# Patient Record
Sex: Female | Born: 1945 | Race: White | Hispanic: No | Marital: Married | Smoking: Former smoker
Health system: Southern US, Community
[De-identification: ages and names within clinical notes are randomized; demographics above are authoritative.]

## PROBLEM LIST (undated history)

## (undated) DIAGNOSIS — F329 Major depressive disorder, single episode, unspecified: Secondary | ICD-10-CM

## (undated) DIAGNOSIS — N189 Chronic kidney disease, unspecified: Secondary | ICD-10-CM

## (undated) DIAGNOSIS — I10 Essential (primary) hypertension: Secondary | ICD-10-CM

## (undated) DIAGNOSIS — Z9289 Personal history of other medical treatment: Secondary | ICD-10-CM

## (undated) DIAGNOSIS — F32A Depression, unspecified: Secondary | ICD-10-CM

## (undated) DIAGNOSIS — C801 Malignant (primary) neoplasm, unspecified: Secondary | ICD-10-CM

## (undated) HISTORY — DX: Chronic kidney disease, unspecified: N18.9

## (undated) HISTORY — PX: COLON SURGERY: SHX602

## (undated) HISTORY — PX: AV FISTULA PLACEMENT: SHX1204

## (undated) HISTORY — PX: CHOLECYSTECTOMY: SHX55

## (undated) HISTORY — DX: Essential (primary) hypertension: I10

## (undated) HISTORY — PX: APPENDECTOMY: SHX54

---

## 1978-09-05 DIAGNOSIS — C189 Malignant neoplasm of colon, unspecified: Secondary | ICD-10-CM | POA: Insufficient documentation

## 2008-02-15 ENCOUNTER — Emergency Department (HOSPITAL_COMMUNITY): Admission: EM | Admit: 2008-02-15 | Discharge: 2008-02-15 | Payer: Self-pay | Admitting: Emergency Medicine

## 2008-07-25 ENCOUNTER — Ambulatory Visit: Payer: Self-pay | Admitting: Nurse Practitioner

## 2008-07-25 DIAGNOSIS — R197 Diarrhea, unspecified: Secondary | ICD-10-CM

## 2008-08-07 LAB — CONVERTED CEMR LAB
AST: 15 units/L (ref 0–37)
Alkaline Phosphatase: 226 units/L — ABNORMAL HIGH (ref 39–117)
BUN: 22 mg/dL (ref 6–23)
Basophils Relative: 0 % (ref 0–1)
Creatinine, Ser: 2.75 mg/dL — ABNORMAL HIGH (ref 0.40–1.20)
Eosinophils Absolute: 0.2 10*3/uL (ref 0.0–0.7)
Eosinophils Relative: 4 % (ref 0–5)
Glucose, Bld: 88 mg/dL (ref 70–99)
HCT: 42.3 % (ref 36.0–46.0)
Lymphs Abs: 1.9 10*3/uL (ref 0.7–4.0)
MCHC: 32.4 g/dL (ref 30.0–36.0)
MCV: 93.6 fL (ref 78.0–100.0)
Monocytes Relative: 8 % (ref 3–12)
Neutrophils Relative %: 57 % (ref 43–77)
RBC: 4.52 M/uL (ref 3.87–5.11)
Total Bilirubin: 0.5 mg/dL (ref 0.3–1.2)
WBC: 6 10*3/uL (ref 4.0–10.5)

## 2008-08-08 ENCOUNTER — Encounter (INDEPENDENT_AMBULATORY_CARE_PROVIDER_SITE_OTHER): Payer: Self-pay | Admitting: Nurse Practitioner

## 2008-08-11 ENCOUNTER — Telehealth (INDEPENDENT_AMBULATORY_CARE_PROVIDER_SITE_OTHER): Payer: Self-pay | Admitting: Nurse Practitioner

## 2008-08-11 ENCOUNTER — Ambulatory Visit: Payer: Self-pay | Admitting: Nurse Practitioner

## 2008-08-12 ENCOUNTER — Encounter (INDEPENDENT_AMBULATORY_CARE_PROVIDER_SITE_OTHER): Payer: Self-pay | Admitting: Nurse Practitioner

## 2008-08-12 LAB — CONVERTED CEMR LAB
BUN: 30 mg/dL — ABNORMAL HIGH (ref 6–23)
CO2: 22 meq/L (ref 19–32)
Chloride: 115 meq/L — ABNORMAL HIGH (ref 96–112)
Glucose, Bld: 97 mg/dL (ref 70–99)
Potassium: 5.1 meq/L (ref 3.5–5.3)
Sodium: 150 meq/L — ABNORMAL HIGH (ref 135–145)

## 2008-08-13 ENCOUNTER — Telehealth (INDEPENDENT_AMBULATORY_CARE_PROVIDER_SITE_OTHER): Payer: Self-pay | Admitting: Nurse Practitioner

## 2008-08-20 ENCOUNTER — Ambulatory Visit: Payer: Self-pay | Admitting: Nurse Practitioner

## 2008-08-21 ENCOUNTER — Encounter (INDEPENDENT_AMBULATORY_CARE_PROVIDER_SITE_OTHER): Payer: Self-pay | Admitting: Nurse Practitioner

## 2008-08-25 LAB — CONVERTED CEMR LAB
Collection Interval-CRCL: 24 hr
Creatinine Clearance: 15 mL/min — ABNORMAL LOW (ref 75–115)
Protein, Ur: 140 mg/24hr — ABNORMAL HIGH (ref 50–100)

## 2008-09-02 ENCOUNTER — Ambulatory Visit: Payer: Self-pay | Admitting: Nurse Practitioner

## 2008-09-02 DIAGNOSIS — N259 Disorder resulting from impaired renal tubular function, unspecified: Secondary | ICD-10-CM | POA: Insufficient documentation

## 2008-09-02 LAB — CONVERTED CEMR LAB
Bilirubin Urine: NEGATIVE
Blood in Urine, dipstick: NEGATIVE
Ketones, urine, test strip: NEGATIVE
Specific Gravity, Urine: <1.005

## 2008-09-03 ENCOUNTER — Encounter (INDEPENDENT_AMBULATORY_CARE_PROVIDER_SITE_OTHER): Payer: Self-pay | Admitting: Nurse Practitioner

## 2008-09-03 LAB — CONVERTED CEMR LAB
Calcium: 9.6 mg/dL (ref 8.4–10.5)
Chloride: 116 meq/L — ABNORMAL HIGH (ref 96–112)
Creatinine, Ser: 2.56 mg/dL — ABNORMAL HIGH (ref 0.40–1.20)
Sodium: 145 meq/L (ref 135–145)

## 2008-09-12 ENCOUNTER — Telehealth (INDEPENDENT_AMBULATORY_CARE_PROVIDER_SITE_OTHER): Payer: Self-pay | Admitting: Nurse Practitioner

## 2008-09-12 ENCOUNTER — Ambulatory Visit (HOSPITAL_COMMUNITY): Admission: RE | Admit: 2008-09-12 | Discharge: 2008-09-12 | Payer: Self-pay | Admitting: Internal Medicine

## 2008-09-17 ENCOUNTER — Ambulatory Visit: Payer: Self-pay | Admitting: Nurse Practitioner

## 2008-10-09 ENCOUNTER — Encounter (INDEPENDENT_AMBULATORY_CARE_PROVIDER_SITE_OTHER): Payer: Self-pay | Admitting: Nurse Practitioner

## 2008-10-24 ENCOUNTER — Ambulatory Visit: Payer: Self-pay | Admitting: Nurse Practitioner

## 2008-10-24 DIAGNOSIS — F39 Unspecified mood [affective] disorder: Secondary | ICD-10-CM | POA: Insufficient documentation

## 2009-03-30 ENCOUNTER — Telehealth (INDEPENDENT_AMBULATORY_CARE_PROVIDER_SITE_OTHER): Payer: Self-pay | Admitting: Nurse Practitioner

## 2010-05-28 ENCOUNTER — Encounter (INDEPENDENT_AMBULATORY_CARE_PROVIDER_SITE_OTHER): Payer: Self-pay | Admitting: Nurse Practitioner

## 2010-10-05 NOTE — Progress Notes (Signed)
Summary: Office Visit//DEPRESSION SCREENING  Office Visit//DEPRESSION SCREENING   Imported By: Arta Bruce 05/28/2010 14:29:51  _____________________________________________________________________  External Attachment:    Type:   Image     Comment:   External Document

## 2011-07-15 ENCOUNTER — Encounter: Payer: Self-pay | Admitting: Family Medicine

## 2011-07-15 ENCOUNTER — Ambulatory Visit (INDEPENDENT_AMBULATORY_CARE_PROVIDER_SITE_OTHER): Payer: Medicare Other | Admitting: Family Medicine

## 2011-07-15 DIAGNOSIS — Z1231 Encounter for screening mammogram for malignant neoplasm of breast: Secondary | ICD-10-CM

## 2011-07-15 DIAGNOSIS — F39 Unspecified mood [affective] disorder: Secondary | ICD-10-CM

## 2011-07-15 DIAGNOSIS — Z79899 Other long term (current) drug therapy: Secondary | ICD-10-CM | POA: Insufficient documentation

## 2011-07-15 DIAGNOSIS — C189 Malignant neoplasm of colon, unspecified: Secondary | ICD-10-CM

## 2011-07-15 DIAGNOSIS — N259 Disorder resulting from impaired renal tubular function, unspecified: Secondary | ICD-10-CM

## 2011-07-15 LAB — CBC WITH DIFFERENTIAL/PLATELET
Basophils Relative: 0.3 % (ref 0.0–3.0)
Eosinophils Relative: 7.5 % — ABNORMAL HIGH (ref 0.0–5.0)
HCT: 35 % — ABNORMAL LOW (ref 36.0–46.0)
Lymphs Abs: 1.2 10*3/uL (ref 0.7–4.0)
MCHC: 33.3 g/dL (ref 30.0–36.0)
MCV: 96.8 fl (ref 78.0–100.0)
Monocytes Absolute: 0.3 10*3/uL (ref 0.1–1.0)
RBC: 3.61 Mil/uL — ABNORMAL LOW (ref 3.87–5.11)
WBC: 5.1 10*3/uL (ref 4.5–10.5)

## 2011-07-15 LAB — BASIC METABOLIC PANEL
BUN: 39 mg/dL — ABNORMAL HIGH (ref 6–23)
CO2: 18 mEq/L — ABNORMAL LOW (ref 19–32)
Chloride: 116 mEq/L — ABNORMAL HIGH (ref 96–112)
Potassium: 5.3 mEq/L — ABNORMAL HIGH (ref 3.5–5.1)

## 2011-07-15 LAB — HEPATIC FUNCTION PANEL
ALT: 8 U/L (ref 0–35)
Albumin: 3.8 g/dL (ref 3.5–5.2)
Total Protein: 6.9 g/dL (ref 6.0–8.3)

## 2011-07-15 LAB — LIPID PANEL: Cholesterol: 151 mg/dL (ref 0–200)

## 2011-07-15 LAB — TSH: TSH: 3.38 u[IU]/mL (ref 0.35–5.50)

## 2011-07-15 MED ORDER — DIPHENOXYLATE-ATROPINE 2.5-0.025 MG PO TABS: 1.0000 | ORAL_TABLET | Freq: Four times a day (QID) | ORAL | Status: AC | PRN

## 2011-07-15 NOTE — Progress Notes (Signed)
  Subjective:    Patient ID: Tammy Hutchinson, female    DOB: 02-11-1946, 65 y.o.   MRN: [id number]  HPI New to establish.  Previous MD- [facility].  [employer] (sees [facility] months).  Colon cancer- had bowel resection, Dr Randa Evens is GI.  UTD on colonoscopy.  Having scopes every 1-2 yrs.  Mood Disorder- pt unsure of dx.  On antipsychotic.  Follows w/ mental health.  Renal insufficiency- this is on pt's problem list.  Has seen Dr Caryn Section in the past but 'i didn't like him'.  Has not had labs done recently.  Health maintenance- overdue on mammo and pap  **pt is very difficult to get info from.  Has limited understanding of questions being asked.  Will blurt out random statements that in no way relate to the question**   Review of Systems No CP, SOB, anorexia, abd pain, HAs, edema, abnormal bleeding or bruising, insomnia, N/V, visual changes, auditory or visual hallucinations, dysuria, frequency.    Objective:   Physical Exam  Vitals reviewed. Constitutional: She is oriented to person, place, and time. She appears well-developed and well-nourished. No distress.  HENT:  Head: Normocephalic and atraumatic.  Eyes: Conjunctivae and EOM are normal. Pupils are equal, round, and reactive to light.  Neck: Normal range of motion. Neck supple. No thyromegaly present.  Cardiovascular: Normal rate, regular rhythm, normal heart sounds and intact distal pulses.   No murmur heard. Pulmonary/Chest: Effort normal and breath sounds normal. No respiratory distress.  Abdominal: Soft. She exhibits no distension. There is no tenderness.  Musculoskeletal: She exhibits no edema.  Lymphadenopathy:    She has no cervical adenopathy.  Neurological: She is alert and oriented to person, place, and time.  Skin: Skin is warm and dry.  Psychiatric:       Scattered thoughts, difficult to follow.  Limited understanding.          Assessment & Plan:

## 2011-07-15 NOTE — Patient Instructions (Signed)
Please schedule your complete physical at your convenience We'll notify you of your lab results Someone will call you with your mammogram Call with any questions or concerns Hang in there!!

## 2011-07-18 ENCOUNTER — Encounter: Payer: Self-pay | Admitting: *Deleted

## 2011-07-18 ENCOUNTER — Telehealth: Payer: Self-pay | Admitting: *Deleted

## 2011-07-18 NOTE — Telephone Encounter (Signed)
Called to advise pt that I did indeed send her results for her labs. She stated she was not sure about the results and would have to get her friend to call in to get the results or explain the results she receives in the mail. I advised the friend would have to be on the release form to receive the information per hipp[place] privacy act. Pt still did not understand and stated "Well thank you anyway"

## 2011-07-24 NOTE — Assessment & Plan Note (Signed)
Pt has not had recent labs done.  Doesn't recall having this dx.  Never followed up w/ renal as recommended.  Recheck labs and refer for nephrology consult if needed.

## 2011-07-24 NOTE — Assessment & Plan Note (Signed)
Pt is uncertain what her diagnosis is but she continues to follow w/ Mental Health and takes her antipsychotic regularly.

## 2011-07-24 NOTE — Assessment & Plan Note (Signed)
Pt has been on antipsychotic which can impact lipids, liver, and thyroid.  Will get labs to assess.

## 2011-07-24 NOTE — Assessment & Plan Note (Signed)
Pt reports she is UTD on colonoscopy and having scopes done every 1-2 yrs by Dr Randa Evens.  Will follow along and assist as able.

## 2011-08-24 ENCOUNTER — Telehealth: Payer: Self-pay | Admitting: Family Medicine

## 2011-08-24 NOTE — Telephone Encounter (Signed)
PER PHONE CALL FROM CINDY AT [facility], PATIENT WAS CONTACTED TO SCHEDULE AN APPOINTMENT, AND IT WAS NOT TO SEE DR. FOX AS PATIENT REQUESTED.  PER CINDY, PATIENT REPEATEDLY REFUSED TO SCHEDULE AN APPT, STATED SHE DIDN'T NEED THE APPT, AND WILL NOT MAKE ONE.

## 2011-08-24 NOTE — Telephone Encounter (Signed)
Noted  

## 2011-09-02 ENCOUNTER — Encounter: Payer: Medicare Other | Admitting: Family Medicine

## 2011-09-02 ENCOUNTER — Ambulatory Visit: Payer: Medicare Other

## 2011-09-27 ENCOUNTER — Ambulatory Visit: Payer: Medicare Other

## 2011-09-27 ENCOUNTER — Ambulatory Visit (INDEPENDENT_AMBULATORY_CARE_PROVIDER_SITE_OTHER): Payer: Medicare Other | Admitting: Family Medicine

## 2011-09-27 ENCOUNTER — Encounter: Payer: Self-pay | Admitting: Family Medicine

## 2011-09-27 ENCOUNTER — Telehealth: Payer: Self-pay | Admitting: *Deleted

## 2011-09-27 ENCOUNTER — Ambulatory Visit
Admission: RE | Admit: 2011-09-27 | Discharge: 2011-09-27 | Disposition: A | Discharge status: Home / Self Care | Payer: Medicare Other | Source: Ambulatory Visit | Attending: Family Medicine | Admitting: Family Medicine

## 2011-09-27 DIAGNOSIS — F39 Unspecified mood [affective] disorder: Secondary | ICD-10-CM

## 2011-09-27 DIAGNOSIS — Z1231 Encounter for screening mammogram for malignant neoplasm of breast: Secondary | ICD-10-CM

## 2011-09-27 DIAGNOSIS — Z0000 Encounter for general adult medical examination without abnormal findings: Secondary | ICD-10-CM

## 2011-09-27 DIAGNOSIS — N259 Disorder resulting from impaired renal tubular function, unspecified: Secondary | ICD-10-CM

## 2011-09-27 LAB — BASIC METABOLIC PANEL
BUN: 29 mg/dL — ABNORMAL HIGH (ref 6–23)
CO2: 17 mEq/L — ABNORMAL LOW (ref 19–32)
Chloride: 118 mEq/L — ABNORMAL HIGH (ref 96–112)
Glucose, Bld: 100 mg/dL — ABNORMAL HIGH (ref 70–99)
Potassium: 4.5 mEq/L (ref 3.5–5.1)

## 2011-09-27 NOTE — Progress Notes (Signed)
  Subjective:    Patient ID: Tammy Hutchinson, female    DOB: 06-24-1946, 66 y.o.   MRN: [id number]  HPI Here today for CPE.  Risk Factors: Colon cancer- follows w/ Dr Randa Evens at [facility] regularly per her report Renal insufficiency- pt is refusing renal appt despite abnormal labs.   Physical Activity: walking regularly Fall Risk: feels steady on her feet Depression: ongoing, chronic problem for pt.  Following w/ 'mental health' regularly Hearing: normal to conversational tones ADL's: independent Cognitive: difficult to follow thought process, memory intact Home Safety: feels safe at home, lives w/ husband Height, Weight, BMI, Visual Acuity: see vitals, vision corrected to 20/20 w/ glasses Counseling: pt declines pap, GYN exam, DEXA, colonoscopy, pneumovax, zostavax, flu.  Has mammo scheduled at 1 Labs Ordered: See A&P Care Plan: See A&P    Review of Systems Patient reports no vision/ hearing changes, adenopathy,fever, weight change,  persistant/recurrent hoarseness , swallowing issues, chest pain, palpitations, edema, persistant/recurrent cough, hemoptysis, dyspnea (rest/exertional/paroxysmal nocturnal), gastrointestinal bleeding (melena, rectal bleeding), abdominal pain, significant heartburn, bowel changes, GU symptoms (dysuria, hematuria, incontinence), Gyn symptoms (abnormal  bleeding, pain),  syncope, focal weakness, memory loss, numbness & tingling, skin/hair/nail changes, abnormal bruising or bleeding.     Objective:   Physical Exam General Appearance:    Alert, cooperative, no distress, appears stated age  Head:    Normocephalic, without obvious abnormality, atraumatic  Eyes:    PERRL, conjunctiva/corneas clear, EOM's intact, fundi    benign, both eyes  Ears:    Normal TM's and external ear canals, both ears  Nose:   Nares normal, septum midline, mucosa normal, no drainage    or sinus tenderness  Throat:   Lips, mucosa, and tongue normal; teeth and gums normal  Neck:   Supple,  symmetrical, trachea midline, no adenopathy;    Thyroid: no enlargement/tenderness/nodules  Back:     Symmetric, no curvature, ROM normal, no CVA tenderness  Lungs:     Clear to auscultation bilaterally, respirations unlabored  Chest Wall:    No tenderness or deformity   Heart:    Regular rate and rhythm, S1 and S2 normal, no murmur, rub   or gallop  Breast Exam:    Deferred- prefers to rely on mammo  Abdomen:     Soft, non-tender, bowel sounds active all four quadrants,    no masses, no organomegaly  Genitalia:    Deferred at pt's request  Rectal:    Extremities:   Extremities normal, atraumatic, no cyanosis or edema  Pulses:   2+ and symmetric all extremities  Skin:   Skin color, texture, turgor normal, no rashes or lesions  Lymph nodes:   Cervical, supraclavicular, and axillary nodes normal  Neurologic:   CNII-XII intact, normal strength, sensation and reflexes    throughout          Assessment & Plan:

## 2011-09-27 NOTE — Patient Instructions (Signed)
Please follow up in 6 months to recheck your kidney function We'll notify you of your lab results Call with any questions or concerns Happy New Year!!!

## 2011-09-27 NOTE — Telephone Encounter (Signed)
[facility] lab tech called office to advise a critical lab results for pt, noted GFR 14.10 as well as high BUN 29 and Creatnine 3.46, spoke to MD Paz in absence of MD Tabori, per call came in after office hours. MD Drue Novel noted history of similar lab results and the matter can wait to be addressed   tomorrow.

## 2011-09-28 ENCOUNTER — Encounter: Payer: Self-pay | Admitting: *Deleted

## 2011-10-03 ENCOUNTER — Telehealth: Payer: Self-pay | Admitting: Family Medicine

## 2011-10-03 NOTE — Telephone Encounter (Signed)
Patient calling today, states she knows she declined the original Nephrology referral Dr. Beverely Low entered in Nov-2013, but she has since changed her mind, and wants to be seen by a Kidney doctor.  Will you please enter a new Referral to Nephrology.

## 2011-10-04 NOTE — Telephone Encounter (Signed)
Referral entered  

## 2011-10-04 NOTE — Assessment & Plan Note (Signed)
Pt's PE WNL w/ exception of strong tobacco smell and poor dentition.  Pt refusing most health maintenance issues despite my recommendations.  Reviewed recent labs.  EKG done- see document for interpretation.  Anticipatory guidance provided.

## 2011-10-04 NOTE — Assessment & Plan Note (Signed)
Chronic problem.  Pt's thought process is difficult to follow.  Not clear how much her psych issues are playing into her refusal of health maintenance.

## 2011-10-04 NOTE — Assessment & Plan Note (Signed)
This is very serious problem for pt- kidney fxn continues to decline.  Pt still refusing renal referral despite my insistence.  Will repeat BMP- if kidney fxn again worsens will refer pt or discuss dismissal.

## 2011-12-06 ENCOUNTER — Other Ambulatory Visit: Payer: Self-pay

## 2011-12-06 DIAGNOSIS — Z01810 Encounter for preprocedural cardiovascular examination: Secondary | ICD-10-CM

## 2011-12-06 DIAGNOSIS — N184 Chronic kidney disease, stage 4 (severe): Secondary | ICD-10-CM

## 2011-12-07 ENCOUNTER — Telehealth: Payer: Self-pay | Admitting: *Deleted

## 2011-12-07 NOTE — Telephone Encounter (Signed)
Pt called & left msg requesting results from mammogram. Tried calling pt back & phone was busy.

## 2011-12-07 NOTE — Telephone Encounter (Signed)
Advised per MD Tabori instructions/reading of Mammogram noted on 07-2011 that pt has a normal mammogram and needs to repeat in one year, pt understood

## 2011-12-13 ENCOUNTER — Encounter: Payer: Self-pay | Admitting: Vascular Surgery

## 2011-12-15 ENCOUNTER — Ambulatory Visit (INDEPENDENT_AMBULATORY_CARE_PROVIDER_SITE_OTHER): Payer: Medicare Other | Admitting: Vascular Surgery

## 2011-12-15 ENCOUNTER — Encounter (INDEPENDENT_AMBULATORY_CARE_PROVIDER_SITE_OTHER): Payer: Medicare Other | Admitting: *Deleted

## 2011-12-15 ENCOUNTER — Encounter: Payer: Self-pay | Admitting: Vascular Surgery

## 2011-12-15 VITALS — BP 138/57 | HR 76 | Resp 18 | Ht 66.0 in | Wt 162.0 lb

## 2011-12-15 DIAGNOSIS — N186 End stage renal disease: Secondary | ICD-10-CM

## 2011-12-15 DIAGNOSIS — Z01810 Encounter for preprocedural cardiovascular examination: Secondary | ICD-10-CM

## 2011-12-15 DIAGNOSIS — N184 Chronic kidney disease, stage 4 (severe): Secondary | ICD-10-CM

## 2011-12-15 NOTE — Progress Notes (Signed)
[facility] [place] HISTORY AND PHYSICAL   History of Present Illness:  Patient is a 66 y.o. year old female who presents for placement of a permanent hemodialysis access. The patient is right handed .  The patient is not currently on hemodialysis.  The cause of renal failure is thought to be secondary to hypertension.  Past Medical History  Diagnosis Date  . Chronic kidney disease   . Hypertension     Past Surgical History  Procedure Date  . Appendectomy   . Cholecystectomy      Social History History  Substance Use Topics  . Smoking status: Former Smoker -- 1.0 packs/day for 5 years    Types: Cigarettes    Quit date: 12/15/2002  . Smokeless tobacco: Never Used  . Alcohol Use: No    Family History Family History  Problem Relation Age of Onset  . Stroke Mother   . Heart disease Father   . Cancer Father     COLON    Allergies  No Known Allergies   Current Outpatient Prescriptions  Medication Sig Dispense Refill  . nebivolol (BYSTOLIC) 5 MG tablet Take 5 mg by mouth daily.      . benztropine (COGENTIN) 1 MG tablet Take 1 mg by mouth at bedtime.        Marland Kitchen loxapine (LOXITANE) 10 MG capsule Take 10 mg by mouth at bedtime.          ROS:   General:  No weight loss, Fever, chills  HEENT: No recent headaches, no nasal bleeding, no visual changes, no sore throat  Neurologic: No dizziness, blackouts, seizures. No recent symptoms of stroke or mini- stroke. No recent episodes of slurred speech, or temporary blindness.  Cardiac: No recent episodes of chest pain/pressure, no shortness of breath at rest.  No shortness of breath with exertion.  Denies history of atrial fibrillation or irregular heartbeat  Vascular: No history of rest pain in feet.  No history of claudication.  No history of non-healing ulcer, No history of DVT   Pulmonary: No home oxygen, no productive cough, no hemoptysis,  No asthma or wheezing  Musculoskeletal:  [ ]  Arthritis, [  ] Low back pain,  [ ]  Joint pain  Hematologic:No history of hypercoagulable state.  No history of easy bleeding.  No history of anemia  Gastrointestinal: No hematochezia or melena,  No gastroesophageal reflux, no trouble swallowing  Urinary: [ ]  chronic Kidney disease, [ ]  on HD - [ ]  MWF or [ ]  TTHS, [ ]  Burning with urination, [ ]  Frequent urination, [ ]  Difficulty urinating;   Skin: No rashes     Physical Examination  Filed Vitals:   12/15/11 1650  BP: 138/57  Pulse: 76  Resp: 18  Height: 5\' 6"  (1.676 m)  Weight: 162 lb (73.483 kg)    Body mass index is 26.15 kg/(m^2).  General:  Alert and oriented, no acute distress HEENT: Normal Neck: No bruit or JVD Pulmonary: Clear to auscultation bilaterally Cardiac: Regular Rate and Rhythm without murmur Gastrointestinal: Soft, non-tender, non-distended, no mass, no scars Skin: No rash Extremity Pulses:  Absent to 1+ radial bilaterally, 2+ brachial pulses bilaterally Musculoskeletal: No deformity or edema  Neurologic: Upper and lower extremity motor 5/5 and symmetric  DATA: She had a vein mapping ultrasound today which I reviewed and interpreted. Showed that the cephalic vein in the forearm is fairly small less than 3 mm in diameter. The basilic vein in the left upper arm was  a better quality approximately 3 mm in diameter at the antecubital level progressing to 66 mm in diameter in the upper arm. The cephalic vein in the right forearm also is small. The upper arm cephalic vein was not visualized bilaterally.   ASSESSMENT:   Patient needs long-term hemodialysis access. She does not have a normal radial pulse bilaterally. Also the cephalic vein is marginal in the wrist bilaterally. She did not have an upper arm cephalic vein. I believe the best option for her would be a 2 stage basilic vein transposition fistula. We will perform the first stage of the fistula on 12/28/2010. Risks benefits possible palpitations and procedure details  were expend the patient and her friend who is with her for the office visit today.  Plan: See above  Fabienne Bruns, MD [facility] [place] Office: [phone] Pager: [phone]

## 2011-12-19 ENCOUNTER — Other Ambulatory Visit: Payer: Self-pay

## 2011-12-19 ENCOUNTER — Encounter (HOSPITAL_COMMUNITY): Payer: Self-pay | Admitting: Pharmacy Technician

## 2011-12-23 ENCOUNTER — Encounter (HOSPITAL_COMMUNITY): Payer: Self-pay

## 2011-12-23 ENCOUNTER — Encounter (HOSPITAL_COMMUNITY)
Admission: RE | Admit: 2011-12-23 | Discharge: 2011-12-23 | Disposition: A | Discharge status: Home / Self Care | Payer: Medicare Other | Source: Ambulatory Visit | Attending: Vascular Surgery | Admitting: Vascular Surgery

## 2011-12-23 ENCOUNTER — Encounter (HOSPITAL_COMMUNITY)
Admission: RE | Admit: 2011-12-23 | Discharge: 2011-12-23 | Disposition: A | Discharge status: Home / Self Care | Payer: Medicare Other | Source: Ambulatory Visit | Attending: Anesthesiology | Admitting: Anesthesiology

## 2011-12-23 HISTORY — DX: Depression, unspecified: F32.A

## 2011-12-23 HISTORY — DX: Malignant (primary) neoplasm, unspecified: C80.1

## 2011-12-23 HISTORY — DX: Major depressive disorder, single episode, unspecified: F32.9

## 2011-12-23 MED ORDER — ACETAMINOPHEN 10 MG/ML IV SOLN
INTRAVENOUS | Status: AC
  Filled 2011-12-23: qty 100

## 2011-12-23 NOTE — Progress Notes (Signed)
Attempted to contacted patient x 4 to notify of PCR results.  Phone continues to ring busy.  Will have nurse follow up on Monday 12/26/11 to attempt to reach patient.

## 2011-12-23 NOTE — Pre-Procedure Instructions (Signed)
[address]  12/23/2011   Your procedure is scheduled on:  Wednesday December 28, 2011  Report to [facility] at 0830 AM.  Call this number if you have problems the morning of surgery: [phone]   Remember:   Do not eat food:After Midnight.  May have clear liquids: up to 4 Hours before arrival. (up to 4:30am)  Clear liquids include soda, tea, black coffee, apple or grape juice, broth.  Take these medicines the morning of surgery with A SIP OF WATER: bystolic,    Do not wear jewelry, make-up or nail polish.  Do not wear lotions, powders, or perfumes. You may wear deodorant.  Do not shave 48 hours prior to surgery.  Do not bring valuables to the hospital.  Contacts, dentures or bridgework may not be worn into surgery.  Leave suitcase in the car. After surgery it may be brought to your room.  For patients admitted to the hospital, checkout time is 11:00 AM the day of discharge.   Patients discharged the day of surgery will not be allowed to drive home.  Name and phone number of your driver: Tammy Hutchinson [phone]  Special Instructions: CHG Shower Use Special Wash: 1/2 bottle night before surgery and 1/2 bottle morning of surgery.   Please read over the following fact sheets that you were given: Pain Booklet, Coughing and Deep Breathing, MRSA Information and Surgical Site Infection Prevention

## 2011-12-26 NOTE — Progress Notes (Signed)
Called pt. reguarding positive pcr screen. Pt. States she does not have the prescription for mupirocin. Pt. asked me to call it in to [facility] at [facility]. Called made today at 83. Instructed pt.to begin using the mupirocin  Ointment .

## 2011-12-27 MED ORDER — SODIUM CHLORIDE 0.9 % IV SOLN
INTRAVENOUS | Status: DC
  Administered 2011-12-28: 14:00:00 via INTRAVENOUS

## 2011-12-27 MED ORDER — CEFAZOLIN SODIUM 1-5 GM-% IV SOLN
1.0000 g | Freq: Once | INTRAVENOUS | Status: AC
  Administered 2011-12-28: 1 g via INTRAVENOUS
  Filled 2011-12-27: qty 50

## 2011-12-28 ENCOUNTER — Encounter (HOSPITAL_COMMUNITY)
Admission: RE | Disposition: A | Discharge status: Home / Self Care | Payer: Self-pay | Source: Ambulatory Visit | Attending: Vascular Surgery

## 2011-12-28 ENCOUNTER — Ambulatory Visit (HOSPITAL_COMMUNITY)
Admission: RE | Admit: 2011-12-28 | Discharge: 2011-12-28 | Disposition: A | Discharge status: Home / Self Care | Payer: Medicare Other | Source: Ambulatory Visit | Attending: Vascular Surgery | Admitting: Vascular Surgery

## 2011-12-28 ENCOUNTER — Encounter (HOSPITAL_COMMUNITY): Payer: Self-pay | Admitting: Anesthesiology

## 2011-12-28 ENCOUNTER — Ambulatory Visit (HOSPITAL_COMMUNITY): Payer: Medicare Other | Admitting: Anesthesiology

## 2011-12-28 ENCOUNTER — Encounter (HOSPITAL_COMMUNITY): Payer: Self-pay | Admitting: *Deleted

## 2011-12-28 DIAGNOSIS — Z01812 Encounter for preprocedural laboratory examination: Secondary | ICD-10-CM | POA: Insufficient documentation

## 2011-12-28 DIAGNOSIS — Z01810 Encounter for preprocedural cardiovascular examination: Secondary | ICD-10-CM | POA: Insufficient documentation

## 2011-12-28 DIAGNOSIS — N186 End stage renal disease: Secondary | ICD-10-CM | POA: Insufficient documentation

## 2011-12-28 DIAGNOSIS — I120 Hypertensive chronic kidney disease with stage 5 chronic kidney disease or end stage renal disease: Secondary | ICD-10-CM | POA: Insufficient documentation

## 2011-12-28 LAB — POCT I-STAT 4, (NA,K, GLUC, HGB,HCT)
HCT: 39 % (ref 36.0–46.0)
Sodium: 150 mEq/L — ABNORMAL HIGH (ref 135–145)

## 2011-12-28 SURGERY — TRANSPOSITION, VEIN, BASILIC
Anesthesia: General | Site: Arm Upper | Laterality: Left | Wound class: Clean

## 2011-12-28 MED ORDER — PHENYLEPHRINE HCL 10 MG/ML IJ SOLN
INTRAMUSCULAR | Status: DC | PRN
  Administered 2011-12-28 (×6): 80 ug via INTRAVENOUS

## 2011-12-28 MED ORDER — EPHEDRINE SULFATE 50 MG/ML IJ SOLN
INTRAMUSCULAR | Status: DC | PRN
  Administered 2011-12-28 (×3): 10 mg via INTRAVENOUS

## 2011-12-28 MED ORDER — ONDANSETRON HCL 4 MG/2ML IJ SOLN
INTRAMUSCULAR | Status: DC | PRN
  Administered 2011-12-28: 4 mg via INTRAVENOUS

## 2011-12-28 MED ORDER — ROCURONIUM BROMIDE 100 MG/10ML IV SOLN
INTRAVENOUS | Status: DC | PRN
  Administered 2011-12-28: 30 mg via INTRAVENOUS

## 2011-12-28 MED ORDER — SODIUM CHLORIDE 0.9 % IV SOLN
INTRAVENOUS | Status: DC | PRN
  Administered 2011-12-28 (×2): via INTRAVENOUS

## 2011-12-28 MED ORDER — OXYCODONE HCL 5 MG PO CAPS: 5.0000 mg | ORAL_CAPSULE | Freq: Four times a day (QID) | ORAL | Status: AC | PRN

## 2011-12-28 MED ORDER — DEXTROSE 5 % IV SOLN
INTRAVENOUS | Status: DC | PRN
  Administered 2011-12-28: 16:00:00 via INTRAVENOUS

## 2011-12-28 MED ORDER — NEOSTIGMINE METHYLSULFATE 1 MG/ML IJ SOLN
INTRAMUSCULAR | Status: DC | PRN
  Administered 2011-12-28: 3.5 mg via INTRAVENOUS

## 2011-12-28 MED ORDER — HYDROMORPHONE HCL PF 1 MG/ML IJ SOLN: 0.2500 mg | INTRAMUSCULAR | Status: DC | PRN

## 2011-12-28 MED ORDER — PROPOFOL 10 MG/ML IV EMUL
INTRAVENOUS | Status: DC | PRN
  Administered 2011-12-28: 30 mg via INTRAVENOUS
  Administered 2011-12-28: 100 mg via INTRAVENOUS

## 2011-12-28 MED ORDER — SODIUM CHLORIDE 0.9 % IR SOLN
Status: DC | PRN
  Administered 2011-12-28: 16:00:00

## 2011-12-28 MED ORDER — SUFENTANIL CITRATE 50 MCG/ML IV SOLN
INTRAVENOUS | Status: DC | PRN
  Administered 2011-12-28: 5 ug via INTRAVENOUS
  Administered 2011-12-28: 15 ug via INTRAVENOUS

## 2011-12-28 MED ORDER — HEPARIN SODIUM (PORCINE) 1000 UNIT/ML IJ SOLN
INTRAMUSCULAR | Status: DC | PRN
  Administered 2011-12-28: 5 mL via INTRAVENOUS

## 2011-12-28 MED ORDER — SODIUM CHLORIDE 0.9 % IV SOLN: 10.0000 mg | INTRAVENOUS | Status: DC | PRN

## 2011-12-28 MED ORDER — HETASTARCH-ELECTROLYTES 6 % IV SOLN
INTRAVENOUS | Status: DC | PRN
  Administered 2011-12-28: 16:00:00 via INTRAVENOUS

## 2011-12-28 MED ORDER — GLYCOPYRROLATE 0.2 MG/ML IJ SOLN
INTRAMUSCULAR | Status: DC | PRN
  Administered 2011-12-28: 0.4 mg via INTRAVENOUS

## 2011-12-28 MED ORDER — MIDAZOLAM HCL 5 MG/5ML IJ SOLN
INTRAMUSCULAR | Status: DC | PRN
  Administered 2011-12-28: 1 mg via INTRAVENOUS

## 2011-12-28 MED ORDER — FENTANYL CITRATE 0.05 MG/ML IJ SOLN
INTRAMUSCULAR | Status: DC | PRN
  Administered 2011-12-28: 25 ug via INTRAVENOUS

## 2011-12-28 MED ORDER — 0.9 % SODIUM CHLORIDE (POUR BTL) OPTIME
TOPICAL | Status: DC | PRN
  Administered 2011-12-28: 1000 mL

## 2011-12-28 SURGICAL SUPPLY — 41 items
CANISTER SUCTION 2500CC (MISCELLANEOUS) ×2 IMPLANT
CLIP TI MEDIUM 6 (CLIP) ×2 IMPLANT
CLIP TI WIDE RED SMALL 6 (CLIP) ×2 IMPLANT
CLOTH BEACON ORANGE TIMEOUT ST (SAFETY) ×2 IMPLANT
COVER PROBE W GEL 5X96 (DRAPES) ×2 IMPLANT
COVER SURGICAL LIGHT HANDLE (MISCELLANEOUS) ×4 IMPLANT
DECANTER SPIKE VIAL GLASS SM (MISCELLANEOUS) ×2 IMPLANT
DERMABOND ADVANCED (GAUZE/BANDAGES/DRESSINGS) ×1
DERMABOND ADVANCED .7 DNX12 (GAUZE/BANDAGES/DRESSINGS) ×1 IMPLANT
DRAIN PENROSE 1/4X12 LTX STRL (WOUND CARE) ×2 IMPLANT
DRAPE ORTHO SPLIT 77X108 STRL (DRAPES) ×1
DRAPE SURG ORHT 6 SPLT 77X108 (DRAPES) ×1 IMPLANT
ELECT REM PT RETURN 9FT ADLT (ELECTROSURGICAL) ×2
ELECTRODE REM PT RTRN 9FT ADLT (ELECTROSURGICAL) ×1 IMPLANT
GAUZE SPONGE 2X2 8PLY STRL LF (GAUZE/BANDAGES/DRESSINGS) ×1 IMPLANT
GEL ULTRASOUND 20GR AQUASONIC (MISCELLANEOUS) ×2 IMPLANT
GLOVE BIO SURGEON STRL SZ 6.5 (GLOVE) ×8 IMPLANT
GLOVE BIO SURGEON STRL SZ7.5 (GLOVE) ×2 IMPLANT
GLOVE BIOGEL PI IND STRL 6.5 (GLOVE) ×2 IMPLANT
GLOVE BIOGEL PI IND STRL 7.0 (GLOVE) ×1 IMPLANT
GLOVE BIOGEL PI INDICATOR 6.5 (GLOVE) ×2
GLOVE BIOGEL PI INDICATOR 7.0 (GLOVE) ×1
GOWN PREVENTION PLUS XLARGE (GOWN DISPOSABLE) ×2 IMPLANT
GOWN STRL NON-REIN LRG LVL3 (GOWN DISPOSABLE) ×6 IMPLANT
KIT BASIN OR (CUSTOM PROCEDURE TRAY) ×2 IMPLANT
KIT ROOM TURNOVER OR (KITS) ×2 IMPLANT
LOOP VESSEL MINI RED (MISCELLANEOUS) IMPLANT
NS IRRIG 1000ML POUR BTL (IV SOLUTION) ×2 IMPLANT
PACK CV ACCESS (CUSTOM PROCEDURE TRAY) ×2 IMPLANT
PAD ARMBOARD 7.5X6 YLW CONV (MISCELLANEOUS) ×4 IMPLANT
SPONGE GAUZE 2X2 STER 10/PKG (GAUZE/BANDAGES/DRESSINGS) ×1
SPONGE SURGIFOAM ABS GEL 100 (HEMOSTASIS) IMPLANT
SUT PROLENE 6 0 CC (SUTURE) ×2 IMPLANT
SUT PROLENE 7 0 BV 1 (SUTURE) ×2 IMPLANT
SUT VIC AB 3-0 SH 27 (SUTURE) ×2
SUT VIC AB 3-0 SH 27X BRD (SUTURE) ×2 IMPLANT
SUT VICRYL 4-0 PS2 18IN ABS (SUTURE) ×4 IMPLANT
TOWEL OR 17X24 6PK STRL BLUE (TOWEL DISPOSABLE) ×2 IMPLANT
TOWEL OR 17X26 10 PK STRL BLUE (TOWEL DISPOSABLE) ×2 IMPLANT
UNDERPAD 30X30 INCONTINENT (UNDERPADS AND DIAPERS) ×2 IMPLANT
WATER STERILE IRR 1000ML POUR (IV SOLUTION) ×2 IMPLANT

## 2011-12-28 NOTE — Transfer of Care (Signed)
Immediate Anesthesia Transfer of Care Note  Patient: Tammy Hutchinson  Procedure(s) Performed: Procedure(s) (LRB): BASCILIC VEIN TRANSPOSITION (Left)  Patient Location: PACU  Anesthesia Type: General  Level of Consciousness: alert   Airway & Oxygen Therapy: Patient Spontanous Breathing  Post-op Assessment: Report given to PACU RN  Post vital signs: stable  Complications: No apparent anesthesia complications

## 2011-12-28 NOTE — Discharge Instructions (Signed)
    12/28/2011 Tammy Hutchinson [id number] Sep 11, 1945  Surgeon(s): Sherren Kerns, MD  Procedure(s): BASCILIC VEIN TRANSPOSITION-left first stage        x Do not stick graft for 12 weeks

## 2011-12-28 NOTE — Interval H&P Note (Signed)
History and Physical Interval Note:  12/28/2011 2:24 PM  Tammy Hutchinson  has presented today for surgery, with the diagnosis of End Stage Renal Disease  The various methods of treatment have been discussed with the patient and family. After consideration of risks, benefits and other options for treatment, the patient has consented to  Procedure(s) (LRB): BASCILIC VEIN TRANSPOSITION (Left) as a surgical intervention .  The patients' history has been reviewed, patient examined, no change in status, stable for surgery.  I have reviewed the patients' chart and labs.  Questions were answered to the patient's satisfaction.     Makayle Krahn E

## 2011-12-28 NOTE — H&P (View-Only) (Signed)
[facility] [place] HISTORY AND PHYSICAL   History of Present Illness:  Patient is a 66 y.o. year old female who presents for placement of a permanent hemodialysis access. The patient is right handed .  The patient is not currently on hemodialysis.  The cause of renal failure is thought to be secondary to hypertension.  Past Medical History  Diagnosis Date  . Chronic kidney disease   . Hypertension     Past Surgical History  Procedure Date  . Appendectomy   . Cholecystectomy      Social History History  Substance Use Topics  . Smoking status: Former Smoker -- 1.0 packs/day for 5 years    Types: Cigarettes    Quit date: 12/15/2002  . Smokeless tobacco: Never Used  . Alcohol Use: No    Family History Family History  Problem Relation Age of Onset  . Stroke Mother   . Heart disease Father   . Cancer Father     COLON    Allergies  No Known Allergies   Current Outpatient Prescriptions  Medication Sig Dispense Refill  . nebivolol (BYSTOLIC) 5 MG tablet Take 5 mg by mouth daily.      . benztropine (COGENTIN) 1 MG tablet Take 1 mg by mouth at bedtime.        . loxapine (LOXITANE) 10 MG capsule Take 10 mg by mouth at bedtime.          ROS:   General:  No weight loss, Fever, chills  HEENT: No recent headaches, no nasal bleeding, no visual changes, no sore throat  Neurologic: No dizziness, blackouts, seizures. No recent symptoms of stroke or mini- stroke. No recent episodes of slurred speech, or temporary blindness.  Cardiac: No recent episodes of chest pain/pressure, no shortness of breath at rest.  No shortness of breath with exertion.  Denies history of atrial fibrillation or irregular heartbeat  Vascular: No history of rest pain in feet.  No history of claudication.  No history of non-healing ulcer, No history of DVT   Pulmonary: No home oxygen, no productive cough, no hemoptysis,  No asthma or wheezing  Musculoskeletal:  [ ] Arthritis, [  ] Low back pain,  [ ] Joint pain  Hematologic:No history of hypercoagulable state.  No history of easy bleeding.  No history of anemia  Gastrointestinal: No hematochezia or melena,  No gastroesophageal reflux, no trouble swallowing  Urinary: [ ] chronic Kidney disease, [ ] on HD - [ ] MWF or [ ] TTHS, [ ] Burning with urination, [ ] Frequent urination, [ ] Difficulty urinating;   Skin: No rashes     Physical Examination  Filed Vitals:   12/15/11 1650  BP: 138/57  Pulse: 76  Resp: 18  Height: 5' 6" (1.676 m)  Weight: 162 lb (73.483 kg)    Body mass index is 26.15 kg/(m^2).  General:  Alert and oriented, no acute distress HEENT: Normal Neck: No bruit or JVD Pulmonary: Clear to auscultation bilaterally Cardiac: Regular Rate and Rhythm without murmur Gastrointestinal: Soft, non-tender, non-distended, no mass, no scars Skin: No rash Extremity Pulses:  Absent to 1+ radial bilaterally, 2+ brachial pulses bilaterally Musculoskeletal: No deformity or edema  Neurologic: Upper and lower extremity motor 5/5 and symmetric  DATA: She had a vein mapping ultrasound today which I reviewed and interpreted. Showed that the cephalic vein in the forearm is fairly small less than 3 mm in diameter. The basilic vein in the left upper arm was   a better quality approximately 3 mm in diameter at the antecubital level progressing to 66 mm in diameter in the upper arm. The cephalic vein in the right forearm also is small. The upper arm cephalic vein was not visualized bilaterally.   ASSESSMENT:   Patient needs long-term hemodialysis access. She does not have a normal radial pulse bilaterally. Also the cephalic vein is marginal in the wrist bilaterally. She did not have an upper arm cephalic vein. I believe the best option for her would be a 2 stage basilic vein transposition fistula. We will perform the first stage of the fistula on 12/28/2010. Risks benefits possible palpitations and procedure details  were expend the patient and her friend who is with her for the office visit today.  Plan: See above  Donny Heffern, MD [facility] [place] Office: [phone] Pager: [phone]  

## 2011-12-28 NOTE — Op Note (Addendum)
Procedure: Left basilic vein transposition fistula first stage, Ultrasound guidance  Preoperative diagnosis: End-stage renal disease  Postoperative diagnosis: Same  Anesthesia: Gen.  Assistant: Newton Pigg, [facility]  Operative findings: 3 mm left basilic vein, 2.5-3 mm left brachial artery  Operative details: After obtaining informed consent, the patient was taken to the operating room. The patient was placed in supine position operating table. After induction of general anesthesia, the patient's entire left upper extremity was prepped and draped in the usual sterile fashion. Next ultrasound was used to identify the left basilic vein. A longitudinal incision was made just above the antecubital crease in order to expose the basilic vein. The vein was of good quality approximately 3 mm in diameter.  Care was taken to try to not injure any sensory nerves and all the motor nerves were identified and protected. The vein was dissected free circumferentially and small side branches ligated and divided between silk ties or clips. Next the brachial artery was exposed by deepening the basilic vein harvest incision just above the antecubital crease. The artery was approximately 2.5-3 mm in diameter with some spasm. This was dissected free circumferentially. Vessel loops were placed around it. There was some spasm within the artery after dissecting it free. Next the distal basilic vein was ligated with a 2 silk tie and the vein transected.  The patient was given 5000 units of intravenous heparin. Vessel loops were used to control the artery proximally and distally. A longitudinal arteriotomy was made with an 11 blade.  The vein was cut to length and sewn end of vein to side of artery using a running 7-0 Prolene suture. Just prior to completion of the anastomosis, it was forebled backbled and thoroughly flushed. The anastomosis was secured and the Vesseloops were released.  There was a palpable thrill in the  proximal aspect of the fistula. There was also good Doppler flow throughout the course of the fistula. At this point all subcutaneous tissues were reapproximated using running 3-0 Vicryl suture. All skin incisions were closed with running 4  0 Vicryl subcuticular stitch. Dermabond was applied to all incisions. The patient tolerated the procedure well and there were no complications. Instrument sponge needle counts were correct at the end of the case. The patient was taken to the recovery room in stable condition.  Fabienne Bruns, MD [facility] [place] Office: [phone] Pager: [phone]

## 2011-12-28 NOTE — Preoperative (Signed)
Beta Blockers   Reason not to administer Beta Blockers: Bystolic 9 p.m. 12/27/2011

## 2011-12-28 NOTE — Anesthesia Preprocedure Evaluation (Addendum)
Anesthesia Evaluation  Patient identified by MRN, date of birth, ID band Patient awake    Reviewed: Allergy & Precautions, H&P , NPO status , Patient's Chart, lab work & pertinent test results, reviewed documented beta blocker date and time   Airway Mallampati: II TM Distance: >3 FB Neck ROM: Full    Dental No notable dental hx. (+) Edentulous Upper, Edentulous Lower and Dental Advisory Given   Pulmonary neg pulmonary ROS,  breath sounds clear to auscultation  Pulmonary exam normal       Cardiovascular hypertension, On Home Beta Blockers and On Medications Rhythm:Regular Rate:Normal     Neuro/Psych PSYCHIATRIC DISORDERS negative neurological ROS     GI/Hepatic negative GI ROS, Neg liver ROS,   Endo/Other  negative endocrine ROS  Renal/GU CRF and DialysisRenal disease  negative genitourinary   Musculoskeletal   Abdominal   Peds  Hematology negative hematology ROS (+)   Anesthesia Other Findings Lone tooth lower left.  Reproductive/Obstetrics negative OB ROS                          Anesthesia Physical Anesthesia Plan  ASA: III  Anesthesia Plan: General   Post-op Pain Management:    Induction: Intravenous  Airway Management Planned: Oral ETT  Additional Equipment:   Intra-op Plan:   Post-operative Plan: Extubation in OR  Informed Consent: I have reviewed the patients History and Physical, chart, labs and discussed the procedure including the risks, benefits and alternatives for the proposed anesthesia with the patient or authorized representative who has indicated his/her understanding and acceptance.   Dental advisory given  Plan Discussed with: CRNA  Anesthesia Plan Comments:         Anesthesia Quick Evaluation

## 2011-12-28 NOTE — Anesthesia Postprocedure Evaluation (Signed)
Anesthesia Post Note  Patient: Tammy Hutchinson  Procedure(s) Performed: Procedure(s) (LRB): BASCILIC VEIN TRANSPOSITION (Left)  Anesthesia type: General  Patient location: PACU  Post pain: Pain level controlled and Adequate analgesia  Post assessment: Post-op Vital signs reviewed, Patient's Cardiovascular Status Stable, Respiratory Function Stable, Patent Airway and Pain level controlled  Last Vitals:  Filed Vitals:   12/28/11 1715  BP: 125/42  Pulse: 79  Temp:   Resp: 18    Post vital signs: Reviewed and stable  Level of consciousness: awake, alert  and oriented  Complications: No apparent anesthesia complications

## 2011-12-29 ENCOUNTER — Telehealth: Payer: Self-pay | Admitting: Vascular Surgery

## 2011-12-29 NOTE — Telephone Encounter (Signed)
Message     Tammy Hutchinson  First stage basilic vein fistula Asst Weston Anna [place] was used  Needs follow up in 1 month  Charles staff message 12/28/11         Patient could not be reached today by phone. Mailed letter to home address to notify of upcoming appointment.

## 2012-01-05 NOTE — Procedures (Unsigned)
CEPHALIC VEIN MAPPING  INDICATION:  Chronic kidney disease, stage 4.  HISTORY:  EXAM:  The right cephalic vein is compressible.  Diameter measurements range from 0.17 to 0.42 cm.  The brachium level right cephalic vein was not adequately visualized.  The right basilic vein is compressible with diameter measurements ranging from 0.28 to 0.49 cm.  The left cephalic vein is compressible.  Diameter measurements range from 0.24 to 0.47 cm.  The left brachium level cephalic vein was not adequately visualized.  The left basilic vein is compressible with diameter measurements ranging from 0.17 to 0.66 cm.  See attached worksheet for all measurements.  IMPRESSION:  Patent bilateral cephalic and basilic veins with diameter measurements as described  above.  ___________________________________________ Janetta Hora. [facility], MD  CH/MEDQ  D:  12/19/2011  T:  12/19/2011  Job:  [id number]

## 2012-01-26 ENCOUNTER — Ambulatory Visit: Payer: Medicare Other | Admitting: Vascular Surgery

## 2012-02-01 ENCOUNTER — Encounter: Payer: Self-pay | Admitting: Vascular Surgery

## 2012-02-02 ENCOUNTER — Ambulatory Visit (INDEPENDENT_AMBULATORY_CARE_PROVIDER_SITE_OTHER): Payer: Medicare Other | Admitting: Vascular Surgery

## 2012-02-02 ENCOUNTER — Encounter: Payer: Self-pay | Admitting: Vascular Surgery

## 2012-02-02 VITALS — BP 133/55 | HR 61 | Temp 98.0°F | Ht 65.5 in | Wt 161.5 lb

## 2012-02-02 DIAGNOSIS — Z48812 Encounter for surgical aftercare following surgery on the circulatory system: Secondary | ICD-10-CM

## 2012-02-02 DIAGNOSIS — N186 End stage renal disease: Secondary | ICD-10-CM

## 2012-02-02 NOTE — Progress Notes (Signed)
Is a 66 year old female who returns today after first stage basilic vein transposition. This was done on 12/28/2011. She denies any numbness or tingling in her left hand. She is currently not on hemodialysis.  Physical exam: Left antecubital incision is well-healed, there is an easily palpable thrill in the fistula.  Assessment: Developing left basilic vein fistula.  Plan: Patient will be scheduled for a followup visit in one month with an ultrasound at that time. If the fistula looks like it has developed to a reasonable diameter at that time. We will schedule her for a second stage basilic vein procedure.  Fabienne Bruns, MD [facility] Office: [phone] Pager: [phone]

## 2012-02-03 NOTE — Progress Notes (Signed)
Addended by: Sharee Pimple on: 02/03/2012 08:14 AM   Modules accepted: Orders

## 2012-02-06 ENCOUNTER — Other Ambulatory Visit: Payer: Self-pay | Admitting: *Deleted

## 2012-02-06 MED ORDER — NEBIVOLOL HCL 5 MG PO TABS: 5.0000 mg | ORAL_TABLET | Freq: Every day | ORAL | Status: DC

## 2012-02-06 NOTE — Telephone Encounter (Signed)
Sent medication via escribe. Letter has been mailed to pt address noted in the chart to advise they are overdue for cpe/ov/labs and the pt needs to contact office to set up appt

## 2012-02-06 NOTE — Telephone Encounter (Signed)
Last OV 09-27-11, ok to fill

## 2012-02-06 NOTE — Telephone Encounter (Signed)
Ok to fill x3- needs 6 month f/u appt if not scheduled

## 2012-03-14 ENCOUNTER — Encounter: Payer: Self-pay | Admitting: Vascular Surgery

## 2012-03-15 ENCOUNTER — Encounter (INDEPENDENT_AMBULATORY_CARE_PROVIDER_SITE_OTHER): Payer: Medicare Other | Admitting: *Deleted

## 2012-03-15 ENCOUNTER — Encounter: Payer: Self-pay | Admitting: Neurosurgery

## 2012-03-15 ENCOUNTER — Ambulatory Visit (INDEPENDENT_AMBULATORY_CARE_PROVIDER_SITE_OTHER): Payer: Medicare Other | Admitting: Neurosurgery

## 2012-03-15 ENCOUNTER — Ambulatory Visit: Payer: Medicare Other | Admitting: Vascular Surgery

## 2012-03-15 VITALS — BP 155/73 | HR 61 | Resp 20 | Ht 65.5 in | Wt 165.0 lb

## 2012-03-15 DIAGNOSIS — N186 End stage renal disease: Secondary | ICD-10-CM

## 2012-03-15 DIAGNOSIS — T82598A Other mechanical complication of other cardiac and vascular devices and implants, initial encounter: Secondary | ICD-10-CM

## 2012-03-15 NOTE — Progress Notes (Addendum)
Subjective:     Patient ID: Tammy Hutchinson, female   DOB: [birth year], 66 y.o.   MRN: [id number]  HPI: 66 year old female patient follows up for scheduling of second stage basilic vein transposition. The initial surgery was 12/28/2011. The patient denies any steal syndrome in her hand, she has no numbness and tingling and has a good grip.   Review of Systems: 12 point review of systems is notable for the difficulties described above otherwise unremarkable     Objective:   Physical Exam: Positive left upper extremity grip, incisions are well-healed, patient is afebrile, vital signs are stable.     Assessment:     Patient with maturing basilic vein transposition status post first stage    Plan:     The patient is scheduled for second stage basilic vein transposition on July 30 with Dr. Darrick Penna. He spoke with the patient at length and she is in agreement with this plan. Her followup will be pending that procedure.  Lauree Chandler ANP  Clinic M.D.: [facility]     Fistula developing may have proximal narrowing Will do 2nd stage and possible patch of proximal portion Risks benefits procedure discussed.  Fabienne Bruns, MD [facility] Office: [phone] Pager: [phone]

## 2012-03-23 NOTE — Procedures (Unsigned)
VASCULAR LAB EXAM  INDICATION:  Follow up left brachiobasilic fistula.  HISTORY: Chronic kidney disease. Diabetes: Cardiac: Hypertension:  EXAM:  The left brachiobasilic arteriovenous fistula was evaluated using color flow imaging and spectral Doppler analysis.  The fistula is patent with a retained valve just distal to the arterial anastomosis. Velocities in this area exceed ultrasound machine limits with a Doppler bruit present.  Just distal to the valve is an area of anatomic narrowing.  The remainder of the fistula is unremarkable.  IMPRESSION:  Patent left brachiobasilic fistula with significantly elevated velocities in the arterial anastomosis due to a retained valve and an anatomically narrowed segment.  Please see attached diagram for details.  ___________________________________________ Janetta Hora Fields, MD  LT/MEDQ  D:  03/15/2012  T:  03/15/2012  Job:  [id number]

## 2012-03-26 ENCOUNTER — Other Ambulatory Visit: Payer: Self-pay

## 2012-03-29 NOTE — Pre-Procedure Instructions (Signed)
20 Tammy Hutchinson  03/29/2012   Your procedure is scheduled on: July 30th.  Report to [facility] at 7:30 AM.  Call this number if you have problems the morning of surgery: [phone]   Remember:   Do not eat food or drink any liquid:After Midnight.      Take these medicines the morning of surgery with A SIP OF WATER: Nebivolol (Bystolic)   Do not wear jewelry, make-up or nail polish.  Do not wear lotions, powders, or perfumes. You may wear deodorant.  Do not shave 48 hours prior to surgery. Men may shave face and neck.  Do not bring valuables to the hospital.  Contacts, dentures or bridgework may not be worn into surgery.  Leave suitcase in the car. After surgery it may be brought to your room.  For patients admitted to the hospital, checkout time is 11:00 AM the day of discharge.   Patients discharged the day of surgery will not be allowed to drive home.  Name and phone number of your driver: ___________  Special Instructions: CHG Shower Use Special Wash: 1/2 bottle night before surgery and 1/2 bottle morning of surgery.   Please read over the following fact sheets that you were given: Pain Booklet, Coughing and Deep Breathing and Surgical Site Infection Prevention

## 2012-03-30 ENCOUNTER — Encounter (HOSPITAL_COMMUNITY): Payer: Self-pay

## 2012-03-30 ENCOUNTER — Encounter (HOSPITAL_COMMUNITY)
Admission: RE | Admit: 2012-03-30 | Discharge: 2012-03-30 | Disposition: A | Discharge status: Home / Self Care | Payer: Medicare Other | Source: Ambulatory Visit | Attending: Vascular Surgery | Admitting: Vascular Surgery

## 2012-03-30 HISTORY — DX: Personal history of other medical treatment: Z92.89

## 2012-04-02 MED ORDER — SODIUM CHLORIDE 0.9 % IV SOLN
INTRAVENOUS | Status: DC
  Administered 2012-04-03: 09:00:00 via INTRAVENOUS

## 2012-04-02 MED ORDER — CEFAZOLIN SODIUM-DEXTROSE 2-3 GM-% IV SOLR
2.0000 g | Freq: Once | INTRAVENOUS | Status: AC
  Administered 2012-04-03: 2 g via INTRAVENOUS
  Filled 2012-04-02: qty 50

## 2012-04-03 ENCOUNTER — Encounter (HOSPITAL_COMMUNITY): Payer: Self-pay | Admitting: Certified Registered"

## 2012-04-03 ENCOUNTER — Encounter (HOSPITAL_COMMUNITY)
Admission: RE | Disposition: A | Discharge status: Home / Self Care | Payer: Self-pay | Source: Ambulatory Visit | Attending: Vascular Surgery

## 2012-04-03 ENCOUNTER — Ambulatory Visit (HOSPITAL_COMMUNITY): Payer: Medicare Other | Admitting: Certified Registered"

## 2012-04-03 ENCOUNTER — Telehealth: Payer: Self-pay | Admitting: Vascular Surgery

## 2012-04-03 ENCOUNTER — Ambulatory Visit (HOSPITAL_COMMUNITY)
Admission: RE | Admit: 2012-04-03 | Discharge: 2012-04-03 | Disposition: A | Discharge status: Home / Self Care | Payer: Medicare Other | Source: Ambulatory Visit | Attending: Vascular Surgery | Admitting: Vascular Surgery

## 2012-04-03 DIAGNOSIS — N186 End stage renal disease: Secondary | ICD-10-CM | POA: Insufficient documentation

## 2012-04-03 DIAGNOSIS — I120 Hypertensive chronic kidney disease with stage 5 chronic kidney disease or end stage renal disease: Secondary | ICD-10-CM | POA: Insufficient documentation

## 2012-04-03 HISTORY — PX: OTHER SURGICAL HISTORY: SHX169

## 2012-04-03 LAB — POCT I-STAT 4, (NA,K, GLUC, HGB,HCT)
Hemoglobin: 12.6 g/dL (ref 12.0–15.0)
Potassium: 5 mEq/L (ref 3.5–5.1)

## 2012-04-03 SURGERY — TRANSPOSITION, VEIN, BASILIC
Anesthesia: General | Site: Arm Upper | Laterality: Left | Wound class: Clean

## 2012-04-03 MED ORDER — PROPOFOL 10 MG/ML IV EMUL
INTRAVENOUS | Status: DC | PRN
  Administered 2012-04-03: 25 mg via INTRAVENOUS
  Administered 2012-04-03: 125 mg via INTRAVENOUS

## 2012-04-03 MED ORDER — HYDROMORPHONE HCL PF 1 MG/ML IJ SOLN
0.2500 mg | INTRAMUSCULAR | Status: DC | PRN
  Administered 2012-04-03: 0.5 mg via INTRAVENOUS

## 2012-04-03 MED ORDER — FENTANYL CITRATE 0.05 MG/ML IJ SOLN
INTRAMUSCULAR | Status: DC | PRN
  Administered 2012-04-03 (×3): 25 ug via INTRAVENOUS
  Administered 2012-04-03: 50 ug via INTRAVENOUS
  Administered 2012-04-03 (×2): 25 ug via INTRAVENOUS
  Administered 2012-04-03: 50 ug via INTRAVENOUS

## 2012-04-03 MED ORDER — SODIUM CHLORIDE 0.9 % IR SOLN
Status: DC | PRN
  Administered 2012-04-03: 11:00:00

## 2012-04-03 MED ORDER — ONDANSETRON HCL 4 MG/2ML IJ SOLN: 4.0000 mg | Freq: Once | INTRAMUSCULAR | Status: DC | PRN

## 2012-04-03 MED ORDER — PHENYLEPHRINE HCL 10 MG/ML IJ SOLN
INTRAMUSCULAR | Status: DC | PRN
  Administered 2012-04-03: 40 ug via INTRAVENOUS
  Administered 2012-04-03 (×3): 80 ug via INTRAVENOUS
  Administered 2012-04-03: 40 ug via INTRAVENOUS
  Administered 2012-04-03: 80 ug via INTRAVENOUS

## 2012-04-03 MED ORDER — SODIUM CHLORIDE 0.9 % IV SOLN
INTRAVENOUS | Status: DC | PRN
  Administered 2012-04-03 (×2): via INTRAVENOUS

## 2012-04-03 MED ORDER — LIDOCAINE HCL (CARDIAC) 20 MG/ML IV SOLN
INTRAVENOUS | Status: DC | PRN
  Administered 2012-04-03: 100 mg via INTRAVENOUS

## 2012-04-03 MED ORDER — HYDROMORPHONE HCL PF 1 MG/ML IJ SOLN
INTRAMUSCULAR | Status: AC
  Filled 2012-04-03: qty 1

## 2012-04-03 MED ORDER — OXYCODONE HCL 5 MG PO TABS: 5.0000 mg | ORAL_TABLET | Freq: Four times a day (QID) | ORAL | Status: AC | PRN

## 2012-04-03 MED ORDER — EPHEDRINE SULFATE 50 MG/ML IJ SOLN
INTRAMUSCULAR | Status: DC | PRN
  Administered 2012-04-03 (×4): 10 mg via INTRAVENOUS

## 2012-04-03 MED ORDER — NEBIVOLOL HCL 5 MG PO TABS
5.0000 mg | ORAL_TABLET | ORAL | Status: AC
  Administered 2012-04-03: 5 mg via ORAL
  Filled 2012-04-03: qty 1

## 2012-04-03 MED ORDER — 0.9 % SODIUM CHLORIDE (POUR BTL) OPTIME
TOPICAL | Status: DC | PRN
  Administered 2012-04-03: 1000 mL

## 2012-04-03 SURGICAL SUPPLY — 47 items
CANISTER SUCTION 2500CC (MISCELLANEOUS) ×2 IMPLANT
CLIP TI MEDIUM 6 (CLIP) ×2 IMPLANT
CLIP TI WIDE RED SMALL 6 (CLIP) ×2 IMPLANT
CLOTH BEACON ORANGE TIMEOUT ST (SAFETY) ×2 IMPLANT
COVER PROBE W GEL 5X96 (DRAPES) IMPLANT
COVER SURGICAL LIGHT HANDLE (MISCELLANEOUS) ×2 IMPLANT
DECANTER SPIKE VIAL GLASS SM (MISCELLANEOUS) IMPLANT
DERMABOND ADVANCED (GAUZE/BANDAGES/DRESSINGS) ×1
DERMABOND ADVANCED .7 DNX12 (GAUZE/BANDAGES/DRESSINGS) ×1 IMPLANT
DRAIN PENROSE 1/4X12 LTX STRL (WOUND CARE) IMPLANT
ELECT REM PT RETURN 9FT ADLT (ELECTROSURGICAL) ×2
ELECTRODE REM PT RTRN 9FT ADLT (ELECTROSURGICAL) ×1 IMPLANT
GAUZE SPONGE 2X2 8PLY STRL LF (GAUZE/BANDAGES/DRESSINGS) IMPLANT
GEL ULTRASOUND 20GR AQUASONIC (MISCELLANEOUS) ×2 IMPLANT
GLOVE BIO SURGEON STRL SZ 6.5 (GLOVE) ×4 IMPLANT
GLOVE BIO SURGEON STRL SZ7.5 (GLOVE) ×2 IMPLANT
GLOVE BIOGEL PI IND STRL 6.5 (GLOVE) ×1 IMPLANT
GLOVE BIOGEL PI IND STRL 7.0 (GLOVE) ×1 IMPLANT
GLOVE BIOGEL PI IND STRL 7.5 (GLOVE) ×1 IMPLANT
GLOVE BIOGEL PI IND STRL 8 (GLOVE) ×1 IMPLANT
GLOVE BIOGEL PI INDICATOR 6.5 (GLOVE) ×1
GLOVE BIOGEL PI INDICATOR 7.0 (GLOVE) ×1
GLOVE BIOGEL PI INDICATOR 7.5 (GLOVE) ×1
GLOVE BIOGEL PI INDICATOR 8 (GLOVE) ×1
GLOVE ORTHOPEDIC STR SZ6.5 (GLOVE) ×2 IMPLANT
GLOVE SURG SS PI 7.5 STRL IVOR (GLOVE) ×2 IMPLANT
GOWN PREVENTION PLUS XLARGE (GOWN DISPOSABLE) ×4 IMPLANT
GOWN STRL NON-REIN LRG LVL3 (GOWN DISPOSABLE) ×4 IMPLANT
KIT BASIN OR (CUSTOM PROCEDURE TRAY) ×2 IMPLANT
KIT ROOM TURNOVER OR (KITS) ×2 IMPLANT
LOOP VESSEL MINI RED (MISCELLANEOUS) IMPLANT
NS IRRIG 1000ML POUR BTL (IV SOLUTION) ×2 IMPLANT
PACK CV ACCESS (CUSTOM PROCEDURE TRAY) ×2 IMPLANT
PAD ARMBOARD 7.5X6 YLW CONV (MISCELLANEOUS) ×4 IMPLANT
SPONGE GAUZE 2X2 STER 10/PKG (GAUZE/BANDAGES/DRESSINGS)
SPONGE SURGIFOAM ABS GEL 100 (HEMOSTASIS) IMPLANT
SUT PROLENE 6 0 CC (SUTURE) ×2 IMPLANT
SUT SILK 3 0 (SUTURE) ×1
SUT SILK 3-0 18XBRD TIE 12 (SUTURE) ×1 IMPLANT
SUT VIC AB 3-0 SH 27 (SUTURE) ×3
SUT VIC AB 3-0 SH 27X BRD (SUTURE) ×3 IMPLANT
SUT VIC AB 4-0 PS2 27 (SUTURE) ×2 IMPLANT
SUT VICRYL 4-0 PS2 18IN ABS (SUTURE) ×4 IMPLANT
TOWEL OR 17X24 6PK STRL BLUE (TOWEL DISPOSABLE) ×2 IMPLANT
TOWEL OR 17X26 10 PK STRL BLUE (TOWEL DISPOSABLE) ×2 IMPLANT
UNDERPAD 30X30 INCONTINENT (UNDERPADS AND DIAPERS) ×2 IMPLANT
WATER STERILE IRR 1000ML POUR (IV SOLUTION) ×2 IMPLANT

## 2012-04-03 NOTE — H&P (View-Only) (Signed)
Subjective:     Patient ID: Tammy Hutchinson, female   DOB: 03/18/1946, 65 y.o.   MRN: [id number]  HPI: 65-year-old female patient follows up for scheduling of second stage basilic vein transposition. The initial surgery was 12/28/2011. The patient denies any steal syndrome in her hand, she has no numbness and tingling and has a good grip.   Review of Systems: 12 point review of systems is notable for the difficulties described above otherwise unremarkable     Objective:   Physical Exam: Positive left upper extremity grip, incisions are well-healed, patient is afebrile, vital signs are stable.     Assessment:     Patient with maturing basilic vein transposition status post first stage    Plan:     The patient is scheduled for second stage basilic vein transposition on July 30 with Dr. Fields. He spoke with the patient at length and she is in agreement with this plan. Her followup will be pending that procedure.  Mantaj Chamberlin ANP  Clinic M.D.: [facility]     Fistula developing may have proximal narrowing Will do 2nd stage and possible patch of proximal portion Risks benefits procedure discussed.  [facility], MD [facility] [place] Office: [phone] Pager: [phone]  

## 2012-04-03 NOTE — Telephone Encounter (Signed)
Could not reach patient, # busy, sent letter, dpm

## 2012-04-03 NOTE — Interval H&P Note (Signed)
History and Physical Interval Note:  04/03/2012 9:43 AM  Tammy Hutchinson  has presented today for surgery, with the diagnosis of End Stage Renal Disease   The various methods of treatment have been discussed with the patient and family. After consideration of risks, benefits and other options for treatment, the patient has consented to  Procedure(s) (LRB): BASCILIC VEIN TRANSPOSITION (Left) as a surgical intervention .  The patient's history has been reviewed, patient examined, no change in status, stable for surgery.  I have reviewed the patient's chart and labs.  Questions were answered to the patient's satisfaction.     FIELDS,CHARLES E

## 2012-04-03 NOTE — Anesthesia Postprocedure Evaluation (Signed)
  Anesthesia Post-op Note  Patient: Tammy Hutchinson  Procedure(s) Performed: Procedure(s) (LRB): BASCILIC VEIN TRANSPOSITION (Left)  Patient Location: PACU  Anesthesia Type: General  Level of Consciousness: awake, alert , oriented and patient cooperative  Airway and Oxygen Therapy: Patient Spontanous Breathing and Patient connected to nasal cannula oxygen  Post-op Pain: mild  Post-op Assessment: Post-op Vital signs reviewed, Patient's Cardiovascular Status Stable, Respiratory Function Stable, Patent Airway, No signs of Nausea or vomiting and Pain level controlled  Post-op Vital Signs: stable  Complications: No apparent anesthesia complications

## 2012-04-03 NOTE — Op Note (Signed)
Procedure: 2nd stage basilic vein transposition fistula left arm Preop: ESRD Postop: ESRD Anesthesia: General Asst: Samantha Rhyne PAC Findings 8  Mm fistula  Operative details: After obtaining informed consent, the patient was taken to the operating room. The patient was placed in supine position on the operating room table. After administration of general anesthesia, the patient's entire left upper extremity was prepped and draped in usual sterile fashion. A longitudinal incision was made in the left antecubital area. Incision was carried down through the subcutaneous tissues down to the level of the preexisting basilic to brachial artery fistula. The vein was approximately 8 mm in diameter. The fistula was dissected free circumferentially and side branches ligated and divided between silk ties.  Three additional longitudinal incisions were made to dissect out the vein to the level of the axilla.  There were several venous branches which were ligated and divided between 3 0 silk ties.  The remainder of the branches were smaller and ligated and divided between clips or ties. After the fistula was completely mobilized several 3 0 Vicryl sutures were placed posterior to the vein to encourage it to be closer to the skin surface.  Also the subcutaneous fat under the skin bridges was debrided so the vein would be closer to the skin surface.    The skin incisions were closed with 4-0 Vicryl subcuticular stitch. Dermabond was applied to all incisions.  The patient tolerated the procedure well and there were no complications. Instrument sponge and needle counts were correct at the end of the case. The patient was taken to the recovery room in stable condition. The patient had a palpable radial pulse at the end of the case.  Fabienne Bruns, MD

## 2012-04-03 NOTE — Anesthesia Preprocedure Evaluation (Addendum)
Anesthesia Evaluation  Patient identified by MRN, date of birth, ID band Patient awake    Reviewed: Allergy & Precautions, H&P , NPO status , Patient's Chart, lab work & pertinent test results  Airway Mallampati: I TM Distance: >3 FB Neck ROM: full    Dental  (+) Edentulous Upper, Dental Advisory Given and Poor Dentition   Pulmonary          Cardiovascular hypertension, Rhythm:regular Rate:Normal     Neuro/Psych    GI/Hepatic   Endo/Other    Renal/GU CRF     Musculoskeletal   Abdominal   Peds  Hematology   Anesthesia Other Findings   Reproductive/Obstetrics                          Anesthesia Physical Anesthesia Plan  ASA: III  Anesthesia Plan: General   Post-op Pain Management:    Induction: Intravenous  Airway Management Planned: Oral ETT and LMA  Additional Equipment:   Intra-op Plan:   Post-operative Plan: Extubation in OR  Informed Consent: I have reviewed the patients History and Physical, chart, labs and discussed the procedure including the risks, benefits and alternatives for the proposed anesthesia with the patient or authorized representative who has indicated his/her understanding and acceptance.     Plan Discussed with: CRNA, Anesthesiologist and Surgeon  Anesthesia Plan Comments:         Anesthesia Quick Evaluation

## 2012-04-03 NOTE — Transfer of Care (Signed)
Immediate Anesthesia Transfer of Care Note  Patient: Tammy Hutchinson  Procedure(s) Performed: Procedure(s) (LRB): BASCILIC VEIN TRANSPOSITION (Left)  Patient Location: PACU  Anesthesia Type: General  Level of Consciousness: awake, alert  and oriented  Airway & Oxygen Therapy: Patient Spontanous Breathing and Patient connected to nasal cannula oxygen  Post-op Assessment: Report given to PACU RN, Post -op Vital signs reviewed and stable and Patient moving all extremities X 4  Post vital signs: Reviewed and stable  Complications: No apparent anesthesia complications

## 2012-04-03 NOTE — Telephone Encounter (Signed)
Message copied by Fredrich Birks on Tue Apr 03, 2012  3:32 PM ------      Message from: Sharee Pimple      Created: Tue Apr 03, 2012  1:03 PM      Regarding: schedule                   ----- Message -----         From: Dara Lords, PA         Sent: 04/03/2012  11:40 AM           To: Sharee Pimple, CMA            Lofgren,MargaretFemale, 66 y.o., 1946-06-14            2nd stage BVT by CEF      F/u in 3-4 weeks.            Thanks,      Lelon Mast

## 2012-04-11 ENCOUNTER — Encounter: Payer: Self-pay | Admitting: Neurosurgery

## 2012-04-11 ENCOUNTER — Ambulatory Visit (INDEPENDENT_AMBULATORY_CARE_PROVIDER_SITE_OTHER): Payer: Medicare Other | Admitting: Neurosurgery

## 2012-04-11 VITALS — BP 110/67 | HR 51 | Resp 16 | Ht 65.5 in | Wt 160.7 lb

## 2012-04-11 DIAGNOSIS — N186 End stage renal disease: Secondary | ICD-10-CM

## 2012-04-11 NOTE — Progress Notes (Addendum)
Subjective:     Patient ID: Tammy Hutchinson, female   DOB: [birth year], 66 y.o.   MRN: [id number]  HPI: 66 year old female patient of Dr. Darrick Penna who just underwent a second stage basilic vein transposition left arm on 04/03/2012. The patient called with concerns of swelling and we brought the patient in for evaluation.   Review of Systems: 12 point review of systems is notable for the difficulties described above otherwise unremarkable     Objective:   Physical Exam: Afebrile, vital signs are stable, incision is well healed there is a virtually no redness very little bruising and no drainage. Dermabond is starting to peel and the incisions looked like they are healing well. There is a palpable thrill throughout.     Assessment:     One-week status post left seconds date BVT, healing well, progress is normal    Plan:     The patient will followup as scheduled with Dr. Darrick Penna, she was reassured that her wounds are exceedingly normal at this point in her arm actually looks better than could be expected. The patient states understanding and will followup with Dr. Darrick Penna.

## 2012-05-02 ENCOUNTER — Encounter: Payer: Self-pay | Admitting: Vascular Surgery

## 2012-05-03 ENCOUNTER — Encounter: Payer: Self-pay | Admitting: Vascular Surgery

## 2012-05-03 ENCOUNTER — Ambulatory Visit (INDEPENDENT_AMBULATORY_CARE_PROVIDER_SITE_OTHER): Payer: Medicare Other | Admitting: Vascular Surgery

## 2012-05-03 VITALS — BP 135/60 | HR 57 | Resp 16 | Ht 65.5 in | Wt 163.0 lb

## 2012-05-03 DIAGNOSIS — N186 End stage renal disease: Secondary | ICD-10-CM

## 2012-05-03 DIAGNOSIS — Z4931 Encounter for adequacy testing for hemodialysis: Secondary | ICD-10-CM

## 2012-05-03 NOTE — Progress Notes (Signed)
Patient is a 24 her old female who returns for followup after second stage basilic vein transposition on 04/03/2012. She is currently not on dialysis. She denies any numbness tingling or aching in her hand.  Physical exam: Filed Vitals:   05/03/12 1532  BP: 135/60  Pulse: 57  Resp: 16  Height: 5' 5.5" (1.664 m)  Weight: 163 lb (73.936 kg)  SpO2: 100%   Left upper extremity well-healed incisions with audible bruit in fistula  Assessment: Maturing left arm AV fistula  PLAN:  Patient will followup with me in 6 weeks' time and we'll obtain a duplex of her fistula that time to assess maturity

## 2012-06-13 ENCOUNTER — Encounter: Payer: Self-pay | Admitting: Vascular Surgery

## 2012-06-14 ENCOUNTER — Ambulatory Visit: Payer: Medicare Other | Admitting: Vascular Surgery

## 2012-07-04 ENCOUNTER — Encounter: Payer: Self-pay | Admitting: Vascular Surgery

## 2012-07-05 ENCOUNTER — Ambulatory Visit (INDEPENDENT_AMBULATORY_CARE_PROVIDER_SITE_OTHER): Payer: Medicare Other | Admitting: Vascular Surgery

## 2012-07-05 ENCOUNTER — Encounter (INDEPENDENT_AMBULATORY_CARE_PROVIDER_SITE_OTHER): Payer: Medicare Other | Admitting: *Deleted

## 2012-07-05 ENCOUNTER — Encounter: Payer: Self-pay | Admitting: Vascular Surgery

## 2012-07-05 VITALS — BP 171/62 | HR 62 | Ht 65.5 in | Wt 169.6 lb

## 2012-07-05 DIAGNOSIS — N186 End stage renal disease: Secondary | ICD-10-CM

## 2012-07-05 DIAGNOSIS — Z4931 Encounter for adequacy testing for hemodialysis: Secondary | ICD-10-CM

## 2012-07-05 NOTE — Progress Notes (Signed)
Patient is a 66 year old female who returns for evaluation after second stage basilic vein transposition fistula. She is currently not on hemodialysis. She denies any numbness or tingling in her hand. Her second stage procedure was performed on July 30.  Review of systems: The patient denies shortness of breath. The patient denies chest pain. The patient denies skin itching.  Physical exam: Filed Vitals:   07/05/12 1604  BP: 171/62  Pulse: 62  Height: 5' 5.5" (1.664 m)  Weight: 169 lb 9.6 oz (76.93 kg)  SpO2: 100%   Left upper extremity: Easily palpable thrill audible bruit throughout the entire course of fistula  Data: The patient had a duplex ultrasound of the fistula today. This shows the fistula diameter is 9-10 mm throughout its course, less than 6 mm from the skin throughout its course  Assessment: Mature basilic vein transposition fistula. It should be ready for use if the patient requires dialysis in the future. She will followup on as-needed basis.  Fabienne Bruns, MD [facility] [place] Office: [phone] Pager: [phone]

## 2012-11-06 ENCOUNTER — Telehealth: Payer: Self-pay | Admitting: Family Medicine

## 2012-11-06 ENCOUNTER — Ambulatory Visit (INDEPENDENT_AMBULATORY_CARE_PROVIDER_SITE_OTHER): Payer: Medicare Other | Admitting: Family Medicine

## 2012-11-06 ENCOUNTER — Encounter: Payer: Self-pay | Admitting: Family Medicine

## 2012-11-06 VITALS — BP 120/70 | HR 63 | Temp 98.3°F | Ht 63.5 in | Wt 163.2 lb

## 2012-11-06 DIAGNOSIS — F39 Unspecified mood [affective] disorder: Secondary | ICD-10-CM

## 2012-11-06 DIAGNOSIS — I10 Essential (primary) hypertension: Secondary | ICD-10-CM | POA: Insufficient documentation

## 2012-11-06 DIAGNOSIS — Z0000 Encounter for general adult medical examination without abnormal findings: Secondary | ICD-10-CM

## 2012-11-06 DIAGNOSIS — N186 End stage renal disease: Secondary | ICD-10-CM

## 2012-11-06 LAB — CBC WITH DIFFERENTIAL/PLATELET
Basophils Absolute: 0 10*3/uL (ref 0.0–0.1)
HCT: 33.9 % — ABNORMAL LOW (ref 36.0–46.0)
Lymphs Abs: 1 10*3/uL (ref 0.7–4.0)
MCV: 88.7 fl (ref 78.0–100.0)
Monocytes Absolute: 0.3 10*3/uL (ref 0.1–1.0)
Platelets: 166 10*3/uL (ref 150.0–400.0)
RDW: 15.4 % — ABNORMAL HIGH (ref 11.5–14.6)

## 2012-11-06 LAB — HEPATIC FUNCTION PANEL
Alkaline Phosphatase: 86 U/L (ref 39–117)
Bilirubin, Direct: 0.1 mg/dL (ref 0.0–0.3)

## 2012-11-06 LAB — LIPID PANEL
HDL: 47.8 mg/dL (ref >39.00)
LDL Cholesterol: 106 mg/dL — ABNORMAL HIGH (ref 0–99)
Total CHOL/HDL Ratio: 4
Triglycerides: 82 mg/dL (ref 0.0–149.0)

## 2012-11-06 LAB — BASIC METABOLIC PANEL
Calcium: 9.6 mg/dL (ref 8.4–10.5)
GFR: 10.46 mL/min — CL (ref >60.00)
Sodium: 142 mEq/L (ref 135–145)

## 2012-11-06 NOTE — Progress Notes (Signed)
  Subjective:    Patient ID: Tammy Hutchinson, female    DOB: 1946/06/08, 67 y.o.   MRN: [id number]  HPI Here today for CPE.  Risk Factors: CRI- following w/ Dr Kathrene Bongo, now has AV fistula in place for impending HD. HTN- chronic problem, on Bystolic.  No CP, SOB, HAs, visual changes, edema Psych d/o- pt reports both therapist and MD retired.  Now seeing [facility] for both Cogentin and Loxitane Physical Activity: some walking but limited Fall Risk: low fall risk Depression: no current depressive sxs Hearing: normal to conversation tones but mildly decreased to whispered voice at 6 ft ADL's: independent Cognitive: denies difficulty w/ memory, attention intact, thought process tangential Home Safety: lives w/ husband, feels safe at home Height, Weight, BMI, Visual Acuity: see vitals, vision corrected to 20/20 w/ glasses Counseling: seeing Dr Randa Evens for GI- UTD on colonoscopy, no longer having paps 'i don't want one', doesn't want a DEXA.  Had mammo last year, not interested this year. Labs Ordered: See A&P Care Plan: See A&P    Review of Systems Patient reports no vision/ hearing changes, adenopathy,fever, weight change,  persistant/recurrent hoarseness , swallowing issues, chest pain, palpitations, edema, persistant/recurrent cough, hemoptysis, dyspnea (rest/exertional/paroxysmal nocturnal), gastrointestinal bleeding (melena, rectal bleeding), abdominal pain, significant heartburn, bowel changes, GU symptoms (dysuria, hematuria, incontinence), Gyn symptoms (abnormal  bleeding, pain),  syncope, focal weakness, memory loss, numbness & tingling, skin/hair/nail changes, abnormal bruising or bleeding, anxiety, or depression.     Objective:   Physical Exam  General Appearance:    Alert, cooperative, no distress, appears older than stated age  Head:    Normocephalic, without obvious abnormality, atraumatic  Eyes:    PERRL, conjunctiva/corneas clear, EOM's intact, fundi    benign, both  eyes  Ears:    Normal TM's and external ear canals, both ears  Nose:   Nares normal, septum midline, mucosa normal, no drainage    or sinus tenderness  Throat:   Lips, mucosa, and tongue normal; teeth and gums normal  Neck:   Supple, symmetrical, trachea midline, no adenopathy;    Thyroid: no enlargement/tenderness/nodules  Back:     Symmetric, no curvature, ROM normal, no CVA tenderness  Lungs:     Clear to auscultation bilaterally, respirations unlabored  Chest Wall:    No tenderness or deformity   Heart:    Regular rate and rhythm, S1 and S2 normal, no murmur, rub   or gallop  Breast Exam:    No tenderness, masses, or nipple abnormality  Abdomen:     Soft, non-tender, bowel sounds active all four quadrants,    no masses, no organomegaly  Genitalia:    Deferred at pt's request  Rectal:    Extremities:   Extremities normal, atraumatic, no cyanosis or edema.  AV fistula in L arm  Pulses:   2+ and symmetric all extremities  Skin:   Skin color, texture, turgor normal, no rashes or lesions  Lymph nodes:   Cervical, supraclavicular, and axillary nodes normal  Neurologic:   CNII-XII intact, normal strength, sensation and reflexes    Throughout.  Tangential thought process, hard to follow          Assessment & Plan:

## 2012-11-06 NOTE — Telephone Encounter (Signed)
Patient came in and refused to pay co-pay for office visit. I told the patient that we would go ahead and bill her for this visit but our new policy states that the co-pay will need to be paid at the the beginning of visit. Patient then stated that she was not going to pay a co-pay and that she wanted to be billed. Again I stated that I understood but our new policy was to collect a co-pay at the beginning of her visit. Patient was very hostile, rude, and mean. Patient was very loud and argumentative in waiting room. Pt then asked was I just a receptionist, I stated that I was and then she stated that she wanted to speak to the manager.  I took the concern to [place].   My name is Charity. I am still in training. While I was checking another pt in, my pt over heard another pt yelling at [facility]. He told me that Amy handle that very professional and that there was no reason why Amy pt was talking in that hostile tone towards her.

## 2012-11-06 NOTE — Telephone Encounter (Signed)
I did not witness interaction but we have a zero tolerance policy for abusive and disruptive behavior.  If it is the feeling of the office that pt should no longer be seen here, I will support that decision.

## 2012-11-06 NOTE — Telephone Encounter (Addendum)
I talked with the patient as per her request while in the office.  When I reiterated what Amy had advised, pt stated it was on her insurance card that she was not responsible to pay a copay in the office.  I asked to see her card.  She has [employer], and on the card, $10 copay for PCP.  When I pointed this out to the patient, and said we can bill her this time, but in the future she will need to pay the $10 at each office visit, she asked to see her nurse and doctor for her appointment, and would not discuss with me further.  Based on the negative interaction with staff in the main lobby, I recommend dismissal, and ask Dr. Rennis Golden input for final decision.  Based on Dr. Rennis Golden agreement, Patient dismissal letter sent 11/29/12. [facility]

## 2012-11-06 NOTE — Telephone Encounter (Signed)
Hope with the lab called regarding the pt recent labs.  Pt has critical labs:Cr:4.5 and GFR:10.46.//AB/CMA

## 2012-11-06 NOTE — Telephone Encounter (Deleted)
Patient came in and refused to pay co-pay for office visit. I told the patient that we would go ahead and bill her for this visit but our new policy states that the co-pay will need to be paid at the the beginning of visit. Patient then stated that she was not going to pay a co-pay and that she wanted to be billed. Again I stated that I understood but our new policy was to collect a co-pay at the beginning of her visit. Patient was very hostile, rude, and mean. Patient was very loud and argumentative in waiting room. Pt then asked was I just a receptionist, I stated that I was and then she stated that she wanted to speak to the manager.   Charity also stated that after I

## 2012-11-06 NOTE — Patient Instructions (Addendum)
Follow up in 6 months to recheck blood pressure and see how you're doing We'll notify you of your lab results and send a copy to Dr Kathrene Bongo Your blood pressure looks good Please consider having a mammogram and bone density test Hang in there!

## 2012-11-07 NOTE — Assessment & Plan Note (Signed)
Chronic problem.  Following w/ Dr Kathrene Bongo.  AV fistula in place.  Check labs-fax to renal.

## 2012-11-07 NOTE — Assessment & Plan Note (Signed)
Pt's PE WNL w/ exception of abnormal thought process.  Pt reports being UTD w/ colonoscopy.  Refusing mammo and DEXA, pneumovax.  Check labs.  Anticipatory guidance provided.

## 2012-11-07 NOTE — Assessment & Plan Note (Signed)
Chronic problem.  Adequate control.  Asymptomatic.  No changes. 

## 2012-11-07 NOTE — Assessment & Plan Note (Signed)
Still unclear as to pt's dx.  Following w/ [facility] to get meds.  Pt w/ tangential thought process, difficult to follow.

## 2012-11-13 ENCOUNTER — Encounter: Payer: Self-pay | Admitting: *Deleted

## 2012-11-27 ENCOUNTER — Encounter: Payer: Self-pay | Admitting: Family Medicine

## 2012-11-29 ENCOUNTER — Telehealth: Payer: Self-pay | Admitting: Family Medicine

## 2012-11-29 NOTE — Telephone Encounter (Signed)
Patient dismissed from [facility] by Katherine Tabori MD, effective 11/26/2012. Dismissal letter sent out by certified / registered mail. rmf °

## 2013-02-15 ENCOUNTER — Telehealth: Payer: Self-pay | Admitting: Family Medicine

## 2013-02-15 NOTE — Telephone Encounter (Signed)
Received signed domestic return receipt verifying delivery of certified letter on 12/08/2012. Article number [id number]. rmf 02/14/13.

## 2013-05-10 ENCOUNTER — Ambulatory Visit: Payer: Medicare Other | Admitting: Family Medicine

## 2013-09-24 ENCOUNTER — Other Ambulatory Visit (HOSPITAL_COMMUNITY): Payer: Self-pay | Admitting: *Deleted

## 2013-09-25 ENCOUNTER — Ambulatory Visit (HOSPITAL_COMMUNITY)
Admission: RE | Admit: 2013-09-25 | Discharge: 2013-09-25 | Disposition: A | Discharge status: Home / Self Care | Payer: Medicare Other | Source: Ambulatory Visit | Attending: Nephrology | Admitting: Nephrology

## 2013-09-25 DIAGNOSIS — D649 Anemia, unspecified: Secondary | ICD-10-CM | POA: Insufficient documentation

## 2013-09-25 DIAGNOSIS — I129 Hypertensive chronic kidney disease with stage 1 through stage 4 chronic kidney disease, or unspecified chronic kidney disease: Secondary | ICD-10-CM | POA: Insufficient documentation

## 2013-09-25 DIAGNOSIS — N186 End stage renal disease: Secondary | ICD-10-CM | POA: Insufficient documentation

## 2013-09-25 MED ORDER — SODIUM CHLORIDE 0.9 % IV SOLN
1020.0000 mg | Freq: Once | INTRAVENOUS | Status: AC
  Administered 2013-09-25: 09:00:00 1020 mg via INTRAVENOUS
  Filled 2013-09-25: qty 34

## 2015-05-04 ENCOUNTER — Encounter (HOSPITAL_COMMUNITY): Payer: Self-pay | Admitting: Emergency Medicine

## 2015-05-04 ENCOUNTER — Emergency Department (HOSPITAL_COMMUNITY)
Admission: EM | Admit: 2015-05-04 | Discharge: 2015-05-05 | Discharge status: Against Medical Advice | Payer: Medicare Other | Attending: Emergency Medicine | Admitting: Emergency Medicine

## 2015-05-04 ENCOUNTER — Emergency Department (HOSPITAL_COMMUNITY): Payer: Medicare Other

## 2015-05-04 DIAGNOSIS — N189 Chronic kidney disease, unspecified: Secondary | ICD-10-CM | POA: Insufficient documentation

## 2015-05-04 DIAGNOSIS — R509 Fever, unspecified: Secondary | ICD-10-CM | POA: Diagnosis not present

## 2015-05-04 DIAGNOSIS — Z85038 Personal history of other malignant neoplasm of large intestine: Secondary | ICD-10-CM | POA: Diagnosis not present

## 2015-05-04 DIAGNOSIS — I129 Hypertensive chronic kidney disease with stage 1 through stage 4 chronic kidney disease, or unspecified chronic kidney disease: Secondary | ICD-10-CM | POA: Diagnosis not present

## 2015-05-04 DIAGNOSIS — R000 Tachycardia, unspecified: Secondary | ICD-10-CM | POA: Insufficient documentation

## 2015-05-04 DIAGNOSIS — Z8659 Personal history of other mental and behavioral disorders: Secondary | ICD-10-CM | POA: Diagnosis not present

## 2015-05-04 DIAGNOSIS — Z87891 Personal history of nicotine dependence: Secondary | ICD-10-CM | POA: Insufficient documentation

## 2015-05-04 DIAGNOSIS — X30XXXA Exposure to excessive natural heat, initial encounter: Secondary | ICD-10-CM | POA: Diagnosis not present

## 2015-05-04 DIAGNOSIS — T6701XA Heatstroke and sunstroke, initial encounter: Secondary | ICD-10-CM

## 2015-05-04 LAB — I-STAT TROPONIN, ED: Troponin i, poc: 0.09 ng/mL (ref 0.00–0.08)

## 2015-05-04 LAB — COMPREHENSIVE METABOLIC PANEL
ALBUMIN: 3.1 g/dL — AB (ref 3.5–5.0)
ALT: 11 U/L — AB (ref 14–54)
AST: 27 U/L (ref 15–41)
Alkaline Phosphatase: 58 U/L (ref 38–126)
Anion gap: 9 (ref 5–15)
BUN: 49 mg/dL — AB (ref 6–20)
CHLORIDE: 115 mmol/L — AB (ref 101–111)
CO2: 15 mmol/L — ABNORMAL LOW (ref 22–32)
CREATININE: 6.08 mg/dL — AB (ref 0.44–1.00)
Calcium: 10 mg/dL (ref 8.9–10.3)
GFR calc Af Amer: 7 mL/min — ABNORMAL LOW (ref >60)
GFR calc non Af Amer: 6 mL/min — ABNORMAL LOW (ref >60)
GLUCOSE: 118 mg/dL — AB (ref 65–99)
POTASSIUM: 4.9 mmol/L (ref 3.5–5.1)
Sodium: 139 mmol/L (ref 135–145)
Total Bilirubin: 0.5 mg/dL (ref 0.3–1.2)
Total Protein: 5.5 g/dL — ABNORMAL LOW (ref 6.5–8.1)

## 2015-05-04 LAB — CBC WITH DIFFERENTIAL/PLATELET
Basophils Absolute: 0 10*3/uL (ref 0.0–0.1)
Basophils Relative: 0 % (ref 0–1)
EOS PCT: 2 % (ref 0–5)
Eosinophils Absolute: 0.1 10*3/uL (ref 0.0–0.7)
HCT: 28.3 % — ABNORMAL LOW (ref 36.0–46.0)
Hemoglobin: 9 g/dL — ABNORMAL LOW (ref 12.0–15.0)
LYMPHS ABS: 0.3 10*3/uL — AB (ref 0.7–4.0)
LYMPHS PCT: 4 % — AB (ref 12–46)
MCH: 31.3 pg (ref 26.0–34.0)
MCHC: 31.8 g/dL (ref 30.0–36.0)
MCV: 98.3 fL (ref 78.0–100.0)
MONO ABS: 0.2 10*3/uL (ref 0.1–1.0)
MONOS PCT: 2 % — AB (ref 3–12)
Neutro Abs: 6.5 10*3/uL (ref 1.7–7.7)
Neutrophils Relative %: 92 % — ABNORMAL HIGH (ref 43–77)
PLATELETS: 131 10*3/uL — AB (ref 150–400)
RBC: 2.88 MIL/uL — ABNORMAL LOW (ref 3.87–5.11)
RDW: 15.8 % — AB (ref 11.5–15.5)
WBC: 7.1 10*3/uL (ref 4.0–10.5)

## 2015-05-04 LAB — I-STAT CG4 LACTIC ACID, ED
LACTIC ACID, VENOUS: 1.91 mmol/L (ref 0.5–2.0)
LACTIC ACID, VENOUS: 0.61 mmol/L (ref 0.5–2.0)

## 2015-05-04 LAB — CK: CK TOTAL: 101 U/L (ref 38–234)

## 2015-05-04 LAB — URINE MICROSCOPIC-ADD ON

## 2015-05-04 LAB — URINALYSIS, ROUTINE W REFLEX MICROSCOPIC
Bilirubin Urine: NEGATIVE
GLUCOSE, UA: NEGATIVE mg/dL
Ketones, ur: NEGATIVE mg/dL
Leukocytes, UA: NEGATIVE
Nitrite: NEGATIVE
PH: 6 (ref 5.0–8.0)
Protein, ur: 100 mg/dL — AB
SPECIFIC GRAVITY, URINE: 1.007 (ref 1.005–1.030)
Urobilinogen, UA: 0.2 mg/dL (ref 0.0–1.0)

## 2015-05-04 LAB — CBG MONITORING, ED: GLUCOSE-CAPILLARY: 107 mg/dL — AB (ref 65–99)

## 2015-05-04 MED ORDER — SODIUM CHLORIDE 0.9 % IV BOLUS (SEPSIS)
30.0000 mL/kg | Freq: Once | INTRAVENOUS | Status: AC
  Administered 2015-05-04: 2217 mL via INTRAVENOUS

## 2015-05-04 MED ORDER — ACETAMINOPHEN 650 MG RE SUPP
650.0000 mg | Freq: Once | RECTAL | Status: AC
  Administered 2015-05-04: 650 mg via RECTAL
  Filled 2015-05-04: qty 1

## 2015-05-04 MED ORDER — VANCOMYCIN HCL 10 G IV SOLR
2000.0000 mg | Freq: Once | INTRAVENOUS | Status: AC
  Administered 2015-05-04: 2000 mg via INTRAVENOUS
  Filled 2015-05-04: qty 2000

## 2015-05-04 MED ORDER — PIPERACILLIN-TAZOBACTAM 3.375 G IVPB 30 MIN
3.3750 g | Freq: Once | INTRAVENOUS | Status: AC
  Administered 2015-05-04: 3.375 g via INTRAVENOUS
  Filled 2015-05-04: qty 50

## 2015-05-04 NOTE — ED Notes (Signed)
Pt to ED via [facility] with pt.  EMS reports they were called due to pt being hot and unresponsive.  Pt was responsive to painful stimuli.  On arrival to ED pt awake but so to answer questions

## 2015-05-04 NOTE — ED Provider Notes (Signed)
CSN: [id number]     Arrival date & time 05/04/15  1949 History   First MD Initiated Contact with Patient 05/04/15 2008     Chief Complaint  Patient presents with  . Heat Exposure     (Consider location/radiation/quality/duration/timing/severity/associated sxs/prior Treatment) Patient is a 69 y.o. female presenting with fever.  Fever Max temp prior to arrival:  107 Temp source:  Temporal Severity:  Severe Onset quality:  Unable to specify Timing:  Unable to specify Progression:  Unable to specify Chronicity:  New Relieved by:  None tried Worsened by:  Nothing tried Ineffective treatments:  None tried Associated symptoms comment:  AMS Risk factors: no immunosuppression     Past Medical History  Diagnosis Date  . Chronic kidney disease   . Cancer     hx of colon cancer  . Hypertension     sees Dr. Corliss Parish [phone]  . Depression   . Hx of transfusion of packed red blood cells    Past Surgical History  Procedure Laterality Date  . Appendectomy    . Cholecystectomy    . Colon surgery      approx. 10 years ago  . Av fistula placement    . Bvt  04/03/12    BVT  Left Bascilic Vein Transposition   Family History  Problem Relation Age of Onset  . Stroke Mother   . Heart disease Father   . Cancer Father     COLON   Social History  Substance Use Topics  . Smoking status: Former Smoker -- 1.00 packs/day for 5 years    Types: Cigarettes    Quit date: 12/15/2002  . Smokeless tobacco: Never Used  . Alcohol Use: No   OB History    No data available     Review of Systems  Unable to perform ROS: Acuity of condition  Constitutional: Positive for fever.      Allergies  Review of patient's allergies indicates no known allergies.  Home Medications   Prior to Admission medications   Medication Sig Start Date End Date Taking? Authorizing Provider  acetaminophen (TYLENOL) 325 MG tablet Take 325 mg by mouth every 6 (six) hours as needed for mild pain,  moderate pain or headache.   Yes Historical Provider, MD  benztropine (COGENTIN) 1 MG tablet Take 1 mg by mouth at bedtime.     Yes Historical Provider, MD  loxapine (LOXITANE) 10 MG capsule Take 10 mg by mouth at bedtime.     Yes Historical Provider, MD  nebivolol (BYSTOLIC) 5 MG tablet Take 1 tablet (5 mg total) by mouth daily. Patient not taking: Reported on 05/04/2015 02/06/12   Midge Minium, MD   BP 142/89 mmHg  Pulse 123  Temp(Src) 103.6 F (39.8 C) (Rectal)  SpO2 94% Physical Exam  Constitutional: She appears well-developed and well-nourished. She appears distressed.  HENT:  Head: Normocephalic and atraumatic.  Eyes: Pupils are equal, round, and reactive to light.  Cardiovascular: Regular rhythm and normal heart sounds.  Tachycardia present.   No murmur heard. Pulmonary/Chest: No stridor. Tachypnea noted. She has no wheezes.  Abdominal: Soft. She exhibits no distension.  Musculoskeletal: She exhibits no edema.  Neurological: GCS eye subscore is 4. GCS verbal subscore is 3. GCS motor subscore is 6.  Skin: She is not diaphoretic.    ED Course  Procedures (including critical care time) Labs Review Labs Reviewed  URINALYSIS, ROUTINE W REFLEX MICROSCOPIC (NOT AT [facility]) - Abnormal; Notable for the following:  Hgb urine dipstick SMALL (*)    Protein, ur 100 (*)    All other components within normal limits  COMPREHENSIVE METABOLIC PANEL - Abnormal; Notable for the following:    Chloride 115 (*)    CO2 15 (*)    Glucose, Bld 118 (*)    BUN 49 (*)    Creatinine, Ser 6.08 (*)    Total Protein 5.5 (*)    Albumin 3.1 (*)    ALT 11 (*)    GFR calc non Af Amer 6 (*)    GFR calc Af Amer 7 (*)    All other components within normal limits  CBC WITH DIFFERENTIAL/PLATELET - Abnormal; Notable for the following:    RBC 2.88 (*)    Hemoglobin 9.0 (*)    HCT 28.3 (*)    RDW 15.8 (*)    Platelets 131 (*)    Neutrophils Relative % 92 (*)    Lymphocytes Relative 4 (*)    Lymphs  Abs 0.3 (*)    Monocytes Relative 2 (*)    All other components within normal limits  I-STAT TROPOININ, ED - Abnormal; Notable for the following:    Troponin i, poc 0.09 (*)    All other components within normal limits  CBG MONITORING, ED - Abnormal; Notable for the following:    Glucose-Capillary 107 (*)    All other components within normal limits  URINE CULTURE  CULTURE, BLOOD (ROUTINE X 2)  CULTURE, BLOOD (ROUTINE X 2)  CK  URINE MICROSCOPIC-ADD ON  I-STAT CG4 LACTIC ACID, ED  I-STAT CG4 LACTIC ACID, ED    Imaging Review Dg Chest Port 1 View  05/04/2015   CLINICAL DATA:  Found unresponsive today.  Fever.  EXAM: PORTABLE CHEST - 1 VIEW  COMPARISON:  12/23/2011  FINDINGS: The cardiac silhouette, mediastinal and hilar contours are within normal limits given the AP projection and low lung volumes. Low lung volumes with vascular crowding and bibasilar atelectasis. No definite infiltrates or effusions.  IMPRESSION: Low lung volumes with vascular crowding and bibasilar atelectasis.   Electronically Signed   By: Marijo Sanes M.D.   On: 05/04/2015 20:31   I have personally reviewed and evaluated these images and lab results as part of my medical decision-making.   EKG Interpretation   Date/Time:  Monday May 04 2015 19:59:42 EDT Ventricular Rate:  121 PR Interval:  145 QRS Duration: 90 QT Interval:  283 QTC Calculation: 401 R Axis:   64 Text Interpretation:  Sinus tachycardia Repol abnrm suggests ischemia,  diffuse leads Nonspecific ST and T wave abnormality No old tracing to  compare Confirmed by Kathrynn Humble, MD, Thelma Comp [phone]) on 05/04/2015 9:02:46 PM      MDM   Final diagnoses:  Hyperthermia     Patient is a 69 year old female with baseline dementia that presents for altered mental status and hyperthermia. Patient's husband states that they're conditioner broke yesterday and patient was very fatigued today and when EMS arrived her temperature was 107. EMS reports the patient  was unresponsive initially but after cooling patient has become more responsive. Upon arrival to the ED the patient has a GCS of 13. A full septic workup will be performed, IV fluid bolus given, IV antibiotics started as this is unclear of sepsis versus heat exposure. On physical exam patient has no obvious source of infection with clear lung sounds patient is to Her without improved following reduction in fever. Patient's heart rates also responded to decrease in temperature going from 123  to 81. Patient's renal function is increased however this appears to be a chronic problem. Patient's lactic acid is normal. On reexamination patient is now alert and able to answer questions and requests to go home. Patient's husband states that she is at baseline. Given what happened tonight we advised him to stay however after risk and benefits were explained the patient and husband still opted to leave [place].    Renne Musca, MD 05/05/15 4403  Varney Biles, MD 05/05/15 0157

## 2015-05-04 NOTE — Discharge Instructions (Signed)
Fever, Adult °A fever is a temperature of 100.4° F (38° C) or above.  °HOME CARE °· Take fever medicine as told by your doctor. Do not  take aspirin for fever if you are younger than 69 years of age. °· If you are given antibiotic medicine, take it as told. Finish the medicine even if you start to feel better. °· Rest. °· Drink enough fluids to keep your pee (urine) clear or pale yellow. Do not drink alcohol. °· Take a bath or shower with room temperature water. Do not use ice water or alcohol sponge baths. °· Wear lightweight, loose clothes. °GET HELP RIGHT AWAY IF:  °· You are short of breath or have trouble breathing. °· You are very weak. °· You are dizzy or you pass out (faint). °· You are very thirsty or are making little or no urine. °· You have new pain. °· You throw up (vomit) or have watery poop (diarrhea). °· You keep throwing up or having watery poop for more than 1 to 2 days. °· You have a stiff neck or light bothers your eyes. °· You have a skin rash. °· You have a fever or problems (symptoms) that last for more than 2 to 3 days. °· You have a fever and your problems quickly get worse. °· You keep throwing up the fluids you drink. °· You do not feel better after 3 days. °· You have new problems. °MAKE SURE YOU:  °· Understand these instructions. °· Will watch your condition. °· Will get help right away if you are not doing well or get worse. °Document Released: 05/31/2008 Document Revised: 11/14/2011 Document Reviewed: 06/23/2011 °Exit[employer]® Patient Information ©2015 [employer]. This information is not intended to replace advice given to you by your health care provider. Make sure you discuss any questions you have with your health care provider. ° °

## 2015-05-05 NOTE — ED Notes (Signed)
Spoke to pt and her husband regarding their decision to sign out AMA. Both states understanding and return precautions.

## 2015-05-06 LAB — URINE CULTURE: Culture: NO GROWTH

## 2015-05-09 LAB — CULTURE, BLOOD (ROUTINE X 2)
CULTURE: NO GROWTH
Culture: NO GROWTH

## 2015-05-15 ENCOUNTER — Emergency Department (HOSPITAL_COMMUNITY): Payer: Medicare Other

## 2015-05-15 ENCOUNTER — Encounter (HOSPITAL_COMMUNITY): Payer: Self-pay | Admitting: Emergency Medicine

## 2015-05-15 ENCOUNTER — Emergency Department (HOSPITAL_COMMUNITY)
Admission: EM | Admit: 2015-05-15 | Discharge: 2015-05-15 | Discharge status: Against Medical Advice | Payer: Medicare Other | Attending: Emergency Medicine | Admitting: Emergency Medicine

## 2015-05-15 DIAGNOSIS — S0101XA Laceration without foreign body of scalp, initial encounter: Secondary | ICD-10-CM

## 2015-05-15 DIAGNOSIS — Z79899 Other long term (current) drug therapy: Secondary | ICD-10-CM | POA: Insufficient documentation

## 2015-05-15 DIAGNOSIS — I120 Hypertensive chronic kidney disease with stage 5 chronic kidney disease or end stage renal disease: Secondary | ICD-10-CM | POA: Diagnosis not present

## 2015-05-15 DIAGNOSIS — Y998 Other external cause status: Secondary | ICD-10-CM | POA: Diagnosis not present

## 2015-05-15 DIAGNOSIS — N186 End stage renal disease: Secondary | ICD-10-CM

## 2015-05-15 DIAGNOSIS — S299XXA Unspecified injury of thorax, initial encounter: Secondary | ICD-10-CM | POA: Insufficient documentation

## 2015-05-15 DIAGNOSIS — Y9389 Activity, other specified: Secondary | ICD-10-CM | POA: Diagnosis not present

## 2015-05-15 DIAGNOSIS — Z87891 Personal history of nicotine dependence: Secondary | ICD-10-CM | POA: Diagnosis not present

## 2015-05-15 DIAGNOSIS — W1839XA Other fall on same level, initial encounter: Secondary | ICD-10-CM | POA: Diagnosis not present

## 2015-05-15 DIAGNOSIS — D649 Anemia, unspecified: Secondary | ICD-10-CM | POA: Diagnosis not present

## 2015-05-15 DIAGNOSIS — Y92009 Unspecified place in unspecified non-institutional (private) residence as the place of occurrence of the external cause: Secondary | ICD-10-CM | POA: Diagnosis not present

## 2015-05-15 DIAGNOSIS — F329 Major depressive disorder, single episode, unspecified: Secondary | ICD-10-CM | POA: Insufficient documentation

## 2015-05-15 DIAGNOSIS — Z85038 Personal history of other malignant neoplasm of large intestine: Secondary | ICD-10-CM | POA: Insufficient documentation

## 2015-05-15 DIAGNOSIS — W19XXXA Unspecified fall, initial encounter: Secondary | ICD-10-CM

## 2015-05-15 DIAGNOSIS — R509 Fever, unspecified: Secondary | ICD-10-CM | POA: Diagnosis present

## 2015-05-15 LAB — URINALYSIS, ROUTINE W REFLEX MICROSCOPIC
Bilirubin Urine: NEGATIVE
GLUCOSE, UA: NEGATIVE mg/dL
Ketones, ur: NEGATIVE mg/dL
LEUKOCYTES UA: NEGATIVE
Nitrite: NEGATIVE
PROTEIN: 100 mg/dL — AB
Specific Gravity, Urine: 1.005 (ref 1.005–1.030)
Urobilinogen, UA: 0.2 mg/dL (ref 0.0–1.0)
pH: 6.5 (ref 5.0–8.0)

## 2015-05-15 LAB — I-STAT CHEM 8, ED
BUN: 41 mg/dL — AB (ref 6–20)
CREATININE: 6.2 mg/dL — AB (ref 0.44–1.00)
Calcium, Ion: 1.25 mmol/L (ref 1.13–1.30)
Chloride: 113 mmol/L — ABNORMAL HIGH (ref 101–111)
Glucose, Bld: 98 mg/dL (ref 65–99)
HEMATOCRIT: 23 % — AB (ref 36.0–46.0)
Hemoglobin: 7.8 g/dL — ABNORMAL LOW (ref 12.0–15.0)
Potassium: 4.9 mmol/L (ref 3.5–5.1)
Sodium: 137 mmol/L (ref 135–145)
TCO2: 14 mmol/L (ref 0–100)

## 2015-05-15 LAB — CBC WITH DIFFERENTIAL/PLATELET
BASOS PCT: 0 % (ref 0–1)
Basophils Absolute: 0 10*3/uL (ref 0.0–0.1)
Eosinophils Absolute: 0 10*3/uL (ref 0.0–0.7)
Eosinophils Relative: 0 % (ref 0–5)
HEMATOCRIT: 24.6 % — AB (ref 36.0–46.0)
HEMOGLOBIN: 7.9 g/dL — AB (ref 12.0–15.0)
LYMPHS ABS: 0.8 10*3/uL (ref 0.7–4.0)
LYMPHS PCT: 12 % (ref 12–46)
MCH: 32.1 pg (ref 26.0–34.0)
MCHC: 32.1 g/dL (ref 30.0–36.0)
MCV: 100 fL (ref 78.0–100.0)
MONOS PCT: 6 % (ref 3–12)
Monocytes Absolute: 0.4 10*3/uL (ref 0.1–1.0)
NEUTROS ABS: 5.5 10*3/uL (ref 1.7–7.7)
NEUTROS PCT: 82 % — AB (ref 43–77)
Platelets: 152 10*3/uL (ref 150–400)
RBC: 2.46 MIL/uL — ABNORMAL LOW (ref 3.87–5.11)
RDW: 15.7 % — ABNORMAL HIGH (ref 11.5–15.5)
WBC: 6.8 10*3/uL (ref 4.0–10.5)

## 2015-05-15 LAB — URINE MICROSCOPIC-ADD ON

## 2015-05-15 LAB — I-STAT CG4 LACTIC ACID, ED: Lactic Acid, Venous: 0.77 mmol/L (ref 0.5–2.0)

## 2015-05-15 LAB — CK: Total CK: 155 U/L (ref 38–234)

## 2015-05-15 MED ORDER — HYDROGEN PEROXIDE 3 % EX SOLN
CUTANEOUS | Status: AC
  Filled 2015-05-15: qty 473

## 2015-05-15 NOTE — ED Notes (Signed)
PA at bedside.

## 2015-05-15 NOTE — ED Notes (Signed)
Pt contact #  Tammy Hutchinson  [phone]

## 2015-05-15 NOTE — ED Notes (Signed)
Patient transported to CT 

## 2015-05-15 NOTE — ED Notes (Signed)
Went in and bathed pt and washed her hair  Pt has hematoma noted behind her left ear that extends up about an inch  Pt also has bruising noted to her right side and breast area  Pt has bruising noted to her left lower leg

## 2015-05-15 NOTE — ED Notes (Signed)
Per EMS pt fell unknown how long she had been in the floor  Pt has a laceration to the back of her head  Pt has mental health issues unknown what diagnosis she has  Husband states pt is acting her norm  Pt denies head, back or neck pain  Pt is incontinent of urine  Pt has fever of 103 per EMS temp  House had no air conditioning in house  Pt had bugs crawling on her during transport

## 2015-05-15 NOTE — ED Provider Notes (Signed)
CSN: [id number]     Arrival date & time 05/15/15  0019 History   First MD Initiated Contact with Patient 05/15/15 0133     Chief Complaint  Patient presents with  . Fall  . Fever     (Consider location/radiation/quality/duration/timing/severity/associated sxs/prior Treatment) HPI Comments: She brought in by EMS after husband returned from work finding his wife on the floor confused. EMS reports that the house as quite dirty. It is very warm. They do not have any air-conditioning. Patient was found to be covered in bed bugs and warm to the touch. Also noted that there is blood in the back of her head but they were unable to locate a laceration.  Patient is a 69 y.o. female presenting with fall and fever. The history is provided by the patient.  Fall This is a recurrent problem. The current episode started today. The problem occurs constantly. The problem has been unchanged. Associated symptoms include a fever. Pertinent negatives include no chest pain, congestion, coughing, diaphoresis, headaches or neck pain. Nothing aggravates the symptoms. She has tried nothing for the symptoms. The treatment provided no relief.  Fever Associated symptoms: no chest pain, no congestion, no cough, no dysuria and no headaches     Past Medical History  Diagnosis Date  . Chronic kidney disease   . Cancer     hx of colon cancer  . Hypertension     sees Dr. Corliss Parish [phone]  . Depression   . Hx of transfusion of packed red blood cells    Past Surgical History  Procedure Laterality Date  . Appendectomy    . Cholecystectomy    . Colon surgery      approx. 10 years ago  . Av fistula placement    . Bvt  04/03/12    BVT  Left Bascilic Vein Transposition   Family History  Problem Relation Age of Onset  . Stroke Mother   . Heart disease Father   . Cancer Father     COLON   Social History  Substance Use Topics  . Smoking status: Former Smoker -- 1.00 packs/day for 5 years    Types:  Cigarettes    Quit date: 12/15/2002  . Smokeless tobacco: Never Used  . Alcohol Use: No   OB History    No data available     Review of Systems  Constitutional: Positive for fever. Negative for diaphoresis.  HENT: Negative for congestion.   Respiratory: Negative for cough and shortness of breath.   Cardiovascular: Negative for chest pain.  Genitourinary: Negative for dysuria.  Musculoskeletal: Negative for back pain and neck pain.  Skin: Positive for wound.  Neurological: Negative for dizziness and headaches.  All other systems reviewed and are negative.     Allergies  Review of patient's allergies indicates no known allergies.  Home Medications   Prior to Admission medications   Medication Sig Start Date End Date Taking? Authorizing Provider  acetaminophen (TYLENOL) 325 MG tablet Take 325 mg by mouth every 6 (six) hours as needed for mild pain, moderate pain or headache.   Yes Historical Provider, MD  benztropine (COGENTIN) 1 MG tablet Take 1 mg by mouth at bedtime.     Yes Historical Provider, MD  loxapine (LOXITANE) 10 MG capsule Take 10 mg by mouth at bedtime.     Yes Historical Provider, MD  nebivolol (BYSTOLIC) 5 MG tablet Take 1 tablet (5 mg total) by mouth daily. Patient not taking: Reported on 05/04/2015 02/06/12  Midge Minium, MD   BP 143/57 mmHg  Pulse 80  Temp(Src) 98.4 F (36.9 C) (Oral)  Resp 20  SpO2 96% Physical Exam  Constitutional: She is oriented to person, place, and time. She appears well-developed and well-nourished.  HENT:  Head: Normocephalic.  Eyes: Pupils are equal, round, and reactive to light.  Neck: Normal range of motion.  Cardiovascular: Normal rate and regular rhythm.   Pulmonary/Chest: Effort normal and breath sounds normal. She exhibits tenderness.    Abdominal: Soft. Bowel sounds are normal.  Musculoskeletal: Normal range of motion.  Neurological: She is alert and oriented to person, place, and time.  Skin: Skin is warm and  dry. No pallor.  Nursing note and vitals reviewed.   ED Course  Procedures (including critical care time) Labs Review Labs Reviewed  CBC WITH DIFFERENTIAL/PLATELET - Abnormal; Notable for the following:    RBC 2.46 (*)    Hemoglobin 7.9 (*)    HCT 24.6 (*)    RDW 15.7 (*)    Neutrophils Relative % 82 (*)    All other components within normal limits  URINALYSIS, ROUTINE W REFLEX MICROSCOPIC (NOT AT [facility]) - Abnormal; Notable for the following:    Hgb urine dipstick SMALL (*)    Protein, ur 100 (*)    All other components within normal limits  I-STAT CHEM 8, ED - Abnormal; Notable for the following:    Chloride 113 (*)    BUN 41 (*)    Creatinine, Ser 6.20 (*)    Hemoglobin 7.8 (*)    HCT 23.0 (*)    All other components within normal limits  CK  URINE MICROSCOPIC-ADD ON  I-STAT CG4 LACTIC ACID, ED  I-STAT CG4 LACTIC ACID, ED    Imaging Review Dg Ribs Unilateral W/chest Right  05/15/2015   CLINICAL DATA:  Patient fell at home today bruising on the right side. Fever.  EXAM: RIGHT RIBS AND CHEST - 3+ VIEW  COMPARISON:  Chest 05/04/2015  FINDINGS: Shallow inspiration. Heart size and pulmonary vascularity are normal for inspiratory effort. Calcification of the aorta. No focal airspace disease or consolidation in the lungs. No blunting of costophrenic angles. No pneumothorax.  Right ribs appear intact. No acute fracture or focal bone lesion identified.  IMPRESSION: No evidence of active pulmonary disease. No acute displaced right rib fractures identified.   Electronically Signed   By: Lucienne Capers M.D.   On: 05/15/2015 03:08   Ct Head Wo Contrast  05/15/2015   CLINICAL DATA:  [place], found down in house. Fever and urinary incontinence. Remote history of colon cancer.  EXAM: CT HEAD WITHOUT CONTRAST  TECHNIQUE: Contiguous axial images were obtained from the base of the skull through the vertex without intravenous contrast.  COMPARISON:  None.  FINDINGS: Mild motion degraded examination.   The ventricles and sulci are normal for age. No intraparenchymal hemorrhage, mass effect nor midline shift. Patchy supratentorial white matter hypodensities are within normal range for patient's age and though non-specific suggest sequelae of chronic small vessel ischemic disease. No acute large vascular territory infarcts.  No abnormal extra-axial fluid collections. Basal cisterns are patent. Moderate calcific atherosclerosis of the carotid siphons.  Large LEFT frontotemporal scalp hematoma with minimal subcutaneous gas, no radiopaque foreign bodies. No skull fracture. The included ocular globes and orbital contents are non-suspicious. The mastoid aircells and included paranasal sinuses are well-aerated. Patient is edentulous.  IMPRESSION: No acute intracranial process. Large LEFT scalp hematoma with laceration. No skull fracture.  Involutional changes.  Moderate white matter hypodensities compatible with chronic small vessel ischemic disease.   Electronically Signed   By: Elon Alas M.D.   On: 05/15/2015 02:47   I have personally reviewed and evaluated these images and lab results as part of my medical decision-making.   EKG Interpretation None     Patient was informed of the lab results, again noting that she is in renal failure and has anemia, which is worsening. She states she refuses dialysis and refuses blood transfusions. I suggested that she stay due to the fall, worsening of her symptoms, worsening anemia and the bruising. She refuses admission to the hospital stating that she wants to go home to attend her sister's funeral tomorrow. She's been informed that she can return anytime she changes her mind about treatment MDM   Final diagnoses:  Fall at home, initial encounter  Scalp laceration, initial encounter  End stage renal disease  Anemia, unspecified anemia type        Junius Creamer, NP 05/15/15 0419  April Palumbo, MD 05/15/15 [phone]

## 2015-05-15 NOTE — Discharge Instructions (Signed)
Anemia, Nonspecific Anemia is a condition in which the concentration of red blood cells or hemoglobin in the blood is below normal. Hemoglobin is a substance in red blood cells that carries oxygen to the tissues of the body. Anemia results in not enough oxygen reaching these tissues.  CAUSES  Common causes of anemia include:   Excessive bleeding. Bleeding may be internal or external. This includes excessive bleeding from periods (in women) or from the intestine.   Poor nutrition.   Chronic kidney, thyroid, and liver disease.  Bone marrow disorders that decrease red blood cell production.  Cancer and treatments for cancer.  HIV, AIDS, and their treatments.  Spleen problems that increase red blood cell destruction.  Blood disorders.  Excess destruction of red blood cells due to infection, medicines, and autoimmune disorders. SIGNS AND SYMPTOMS   Minor weakness.   Dizziness.   Headache.  Palpitations.   Shortness of breath, especially with exercise.   Paleness.  Cold sensitivity.  Indigestion.  Nausea.  Difficulty sleeping.  Difficulty concentrating. Symptoms may occur suddenly or they may develop slowly.  DIAGNOSIS  Additional blood tests are often needed. These help your health care provider determine the best treatment. Your health care provider will check your stool for blood and look for other causes of blood loss.  TREATMENT  Treatment varies depending on the cause of the anemia. Treatment can include:   Supplements of iron, vitamin T73, or folic acid.   Hormone medicines.   A blood transfusion. This may be needed if blood loss is severe.   Hospitalization. This may be needed if there is significant continual blood loss.   Dietary changes.  Spleen removal. HOME CARE INSTRUCTIONS Keep all follow-up appointments. It often takes many weeks to correct anemia, and having your health care provider check on your condition and your response to  treatment is very important. SEEK IMMEDIATE MEDICAL CARE IF:   You develop extreme weakness, shortness of breath, or chest pain.   You become dizzy or have trouble concentrating.  You develop heavy vaginal bleeding.   You develop a rash.   You have bloody or black, tarry stools.   You faint.   You vomit up blood.   You vomit repeatedly.   You have abdominal pain.  You have a fever or persistent symptoms for more than 2-3 days.   You have a fever and your symptoms suddenly get worse.   You are dehydrated.  MAKE SURE YOU:  Understand these instructions.  Will watch your condition.  Will get help right away if you are not doing well or get worse. Document Released: 09/29/2004 Document Revised: 04/24/2013 Document Reviewed: 02/15/2013 [facility] Patient Information 2015 [place], [place]. This information is not intended to replace advice given to you by your health care provider. Make sure you discuss any questions you have with your health care provider. With tonight's visit. It is again noted that your kidneys are failing, and to inform you that your refusing dialysis and also was noted that your anemic your refusing transfusion. He has significant bruising to your scalp and right side and refusing admission because you would like to attend to her sister's funeral at any time you develop new worsening symptoms or change her mind about treatment, please return immediately to the hospital for further evaluation and treatment

## 2015-08-25 ENCOUNTER — Encounter (HOSPITAL_COMMUNITY): Payer: Self-pay | Admitting: Emergency Medicine

## 2015-08-25 ENCOUNTER — Emergency Department (HOSPITAL_COMMUNITY): Payer: Medicare Other

## 2015-08-25 ENCOUNTER — Emergency Department (HOSPITAL_COMMUNITY)
Admission: EM | Admit: 2015-08-25 | Discharge: 2015-08-25 | Disposition: A | Discharge status: Home / Self Care | Payer: Medicare Other | Attending: Emergency Medicine | Admitting: Emergency Medicine

## 2015-08-25 DIAGNOSIS — I120 Hypertensive chronic kidney disease with stage 5 chronic kidney disease or end stage renal disease: Secondary | ICD-10-CM | POA: Insufficient documentation

## 2015-08-25 DIAGNOSIS — Y998 Other external cause status: Secondary | ICD-10-CM | POA: Insufficient documentation

## 2015-08-25 DIAGNOSIS — W19XXXA Unspecified fall, initial encounter: Secondary | ICD-10-CM

## 2015-08-25 DIAGNOSIS — N186 End stage renal disease: Secondary | ICD-10-CM | POA: Diagnosis not present

## 2015-08-25 DIAGNOSIS — Z992 Dependence on renal dialysis: Secondary | ICD-10-CM | POA: Insufficient documentation

## 2015-08-25 DIAGNOSIS — Z8659 Personal history of other mental and behavioral disorders: Secondary | ICD-10-CM | POA: Insufficient documentation

## 2015-08-25 DIAGNOSIS — Y9389 Activity, other specified: Secondary | ICD-10-CM | POA: Insufficient documentation

## 2015-08-25 DIAGNOSIS — Y92512 Supermarket, store or market as the place of occurrence of the external cause: Secondary | ICD-10-CM | POA: Insufficient documentation

## 2015-08-25 DIAGNOSIS — Z87891 Personal history of nicotine dependence: Secondary | ICD-10-CM | POA: Insufficient documentation

## 2015-08-25 DIAGNOSIS — Z85038 Personal history of other malignant neoplasm of large intestine: Secondary | ICD-10-CM | POA: Insufficient documentation

## 2015-08-25 DIAGNOSIS — S0083XA Contusion of other part of head, initial encounter: Secondary | ICD-10-CM | POA: Diagnosis not present

## 2015-08-25 DIAGNOSIS — W01198A Fall on same level from slipping, tripping and stumbling with subsequent striking against other object, initial encounter: Secondary | ICD-10-CM | POA: Insufficient documentation

## 2015-08-25 DIAGNOSIS — S0990XA Unspecified injury of head, initial encounter: Secondary | ICD-10-CM | POA: Diagnosis present

## 2015-08-25 NOTE — ED Provider Notes (Signed)
CSN: [id number]     Arrival date & time 08/25/15  1232 History   First MD Initiated Contact with Patient 08/25/15 1235     Chief Complaint  Patient presents with  . Fall     (Consider location/radiation/quality/duration/timing/severity/associated sxs/prior Treatment) HPI Comments: Patient presents with complaint of head injury. Patient tripped and fell while at the drugstore earlier. She struck the front of her head on concrete. She did not lose consciousness. She denies neck pain or headache. Patient was transported to the emergency department for evaluation of head injury. Patient denies vomiting, vision change, weakness in arms or legs. She has an abrasion to the forehead, no treatments prior to arrival. Onset of symptoms acute. Course is constant. Nothing makes symptoms better or worse.  The history is provided by the patient.    Past Medical History  Diagnosis Date  . Chronic kidney disease   . Cancer (HCC)     hx of colon cancer  . Hypertension     sees Dr. Corliss Parish [phone]  . Depression   . Hx of transfusion of packed red blood cells    Past Surgical History  Procedure Laterality Date  . Appendectomy    . Cholecystectomy    . Colon surgery      approx. 10 years ago  . Av fistula placement    . Bvt  04/03/12    BVT  Left Bascilic Vein Transposition   Family History  Problem Relation Age of Onset  . Stroke Mother   . Heart disease Father   . Cancer Father     COLON   Social History  Substance Use Topics  . Smoking status: Former Smoker -- 1.00 packs/day for 5 years    Types: Cigarettes    Quit date: 12/15/2002  . Smokeless tobacco: Never Used  . Alcohol Use: No   OB History    No data available     Review of Systems  Constitutional: Negative for fatigue.  HENT: Negative for tinnitus.   Eyes: Negative for photophobia, pain and visual disturbance.  Respiratory: Negative for shortness of breath.   Cardiovascular: Negative for chest pain.   Gastrointestinal: Negative for nausea and vomiting.  Musculoskeletal: Negative for back pain, gait problem and neck pain.  Skin: Positive for wound.  Neurological: Negative for dizziness, weakness, light-headedness, numbness and headaches.  Psychiatric/Behavioral: Negative for confusion and decreased concentration.    Allergies  Review of patient's allergies indicates no known allergies.  Home Medications   Prior to Admission medications   Medication Sig Start Date End Date Taking? Authorizing Provider  acetaminophen (TYLENOL) 325 MG tablet Take 325 mg by mouth every 6 (six) hours as needed for mild pain, moderate pain or headache.    Historical Provider, MD  benztropine (COGENTIN) 1 MG tablet Take 1 mg by mouth at bedtime.      Historical Provider, MD  loxapine (LOXITANE) 10 MG capsule Take 10 mg by mouth at bedtime.      Historical Provider, MD  nebivolol (BYSTOLIC) 5 MG tablet Take 1 tablet (5 mg total) by mouth daily. Patient not taking: Reported on 05/04/2015 02/06/12   Midge Minium, MD   BP 173/69 mmHg  Pulse 82  Temp(Src) 97.4 F (36.3 C) (Axillary)  Resp 16  SpO2 98%   Physical Exam  Constitutional: She is oriented to person, place, and time. She appears well-developed and well-nourished.  HENT:  Head: Normocephalic. Head is without raccoon's eyes and without Battle's sign.  Right Ear:  Tympanic membrane, external ear and ear canal normal. No hemotympanum.  Left Ear: Tympanic membrane, external ear and ear canal normal. No hemotympanum.  Nose: Nose normal. No nasal septal hematoma.  Mouth/Throat: Uvula is midline, oropharynx is clear and moist and mucous membranes are normal.  Left forehead abrasion, no laceration, minor contusion.  Eyes: Conjunctivae, EOM and lids are normal. Pupils are equal, round, and reactive to light. Right eye exhibits no nystagmus. Left eye exhibits no nystagmus.  No visible hyphema noted  Neck: Normal range of motion. Neck supple.   Cardiovascular: Normal rate and regular rhythm.   Pulmonary/Chest: Effort normal and breath sounds normal.  Abdominal: Soft. There is no tenderness.  Musculoskeletal:       Cervical back: She exhibits normal range of motion, no tenderness and no bony tenderness.       Thoracic back: She exhibits no tenderness and no bony tenderness.       Lumbar back: She exhibits no tenderness and no bony tenderness.  Neurological: She is alert and oriented to person, place, and time. She has normal strength and normal reflexes. No cranial nerve deficit or sensory deficit. Coordination normal. GCS eye subscore is 4. GCS verbal subscore is 5. GCS motor subscore is 6.  Skin: Skin is warm and dry.  Psychiatric: She has a normal mood and affect.  Nursing note and vitals reviewed.   ED Course  Procedures (including critical care time) Labs Review Labs Reviewed - No data to display  Imaging Review Ct Head Wo Contrast  08/25/2015  CLINICAL DATA:  [place] today while walking into drug store, struck forehead, headache, frontal not, history hypertension, colon cancer EXAM: CT HEAD WITHOUT CONTRAST TECHNIQUE: Contiguous axial images were obtained from the base of the skull through the vertex without intravenous contrast. COMPARISON:  05/15/2015 FINDINGS: Generalized atrophy. Normal ventricular morphology. No midline shift or mass effect. Small vessel chronic ischemic changes of deep cerebral white matter. No intracranial hemorrhage, mass lesion, or evidence acute infarction. No extra-axial fluid collections. Frontal scalp hematoma extending LEFT of midline. Foci of gas at the frontal hematoma question laceration. Atherosclerotic calcifications in vertebral and internal carotid arteries at skullbase. Small lucent lesion within the frontal bone question small calvarial hemangioma. Bones and sinuses otherwise unremarkable. IMPRESSION: Atrophy with small vessel chronic ischemic changes of deep cerebral white matter. No acute  intracranial abnormalities. Electronically Signed   By: Lavonia Dana M.D.   On: 08/25/2015 16:14   I have personally reviewed and evaluated these images and lab results as part of my medical decision-making.   EKG Interpretation None      12:56 PM Patient seen and examined. Work-up initiated. Medications ordered.   Vital signs reviewed and are as follows: BP 173/69 mmHg  Pulse 82  Temp(Src) 97.4 F (36.3 C) (Axillary)  Resp 16  SpO2 98%  4:19 PM CT negative. Will discharge to home.  Patient was counseled on head injury precautions and symptoms that should indicate their return to the ED.  These include severe worsening headache, vision changes, confusion, loss of consciousness, trouble walking, nausea & vomiting, or weakness/tingling in extremities.     MDM   Final diagnoses:  Contusion of forehead, initial encounter  Fall, initial encounter   Patient with fall no blood thinners. CT head negative. No C-spine tenderness. No neurological findings. Ambulatory in ED. Will discharge to home.    Carlisle Cater, PA-C 08/25/15 1621  Noemi Chapel, MD 08/26/15 [phone]

## 2015-08-25 NOTE — ED Notes (Signed)
Pt in CT during rounds

## 2015-08-25 NOTE — ED Notes (Signed)
Pt returned from CT. Waiting for results.  

## 2015-08-25 NOTE — ED Notes (Signed)
Pt ambulated. Steady on her feet. PT placed in chair at bedside due to comfort. PA approved

## 2015-08-25 NOTE — ED Notes (Signed)
Patient comes in today with complaints of a fall. States was walking in [employer] and fell. Per EMS patient was slow to answer questions patient able to state month but not year. Patient denies being on any blood thinners. EMS states no LOC. Patient has a Hematoma to the anterior head. Bleeding controlled.

## 2015-08-25 NOTE — Discharge Instructions (Signed)
Please read and follow all provided instructions.  Your diagnoses today include:  1. Contusion of forehead, initial encounter   2. Fall, initial encounter     Tests performed today include:  CT scan of your head that did not show any serious injury.  Vital signs. See below for your results today.   Medications prescribed:   None  Take any prescribed medications only as directed.  Home care instructions:  Follow any educational materials contained in this packet.  BE VERY CAREFUL not to take multiple medicines containing Tylenol (also called acetaminophen). Doing so can lead to an overdose which can damage your liver and cause liver failure and possibly death.   Follow-up instructions: Please follow-up with your primary care provider in the next 3 days for further evaluation of your symptoms.   Return instructions:  SEEK IMMEDIATE MEDICAL ATTENTION IF:  There is confusion or drowsiness (although children frequently become drowsy after injury).   You cannot awaken the injured person.   You have more than one episode of vomiting.   You notice dizziness or unsteadiness which is getting worse, or inability to walk.   You have convulsions or unconsciousness.   You experience severe, persistent headaches not relieved by Tylenol.  You cannot use arms or legs normally.   There are changes in pupil sizes. (This is the black center in the colored part of the eye)   There is clear or bloody discharge from the nose or ears.   You have change in speech, vision, swallowing, or understanding.   Localized weakness, numbness, tingling, or change in bowel or bladder control.  You have any other emergent concerns.  Additional Information: You have had a head injury which does not appear to require admission at this time.  Your vital signs today were: BP 180/72 mmHg   Pulse 76   Temp(Src) 97.4 F (36.3 C) (Axillary)   Resp 17   SpO2 100% If your blood pressure (BP) was elevated  above 135/85 this visit, please have this repeated by your doctor within one month. --------------

## 2016-05-03 IMAGING — CR DG RIBS W/ CHEST 3+V*R*
4 series · 4 of 4 positions shown · non-contrast
Comparison: Chest 05/04/2015

CLINICAL DATA: Patient fell at home today bruising on the right
side. Fever.

EXAM:
RIGHT RIBS AND CHEST - 3+ VIEW

[x chest ap (1 of 4)]
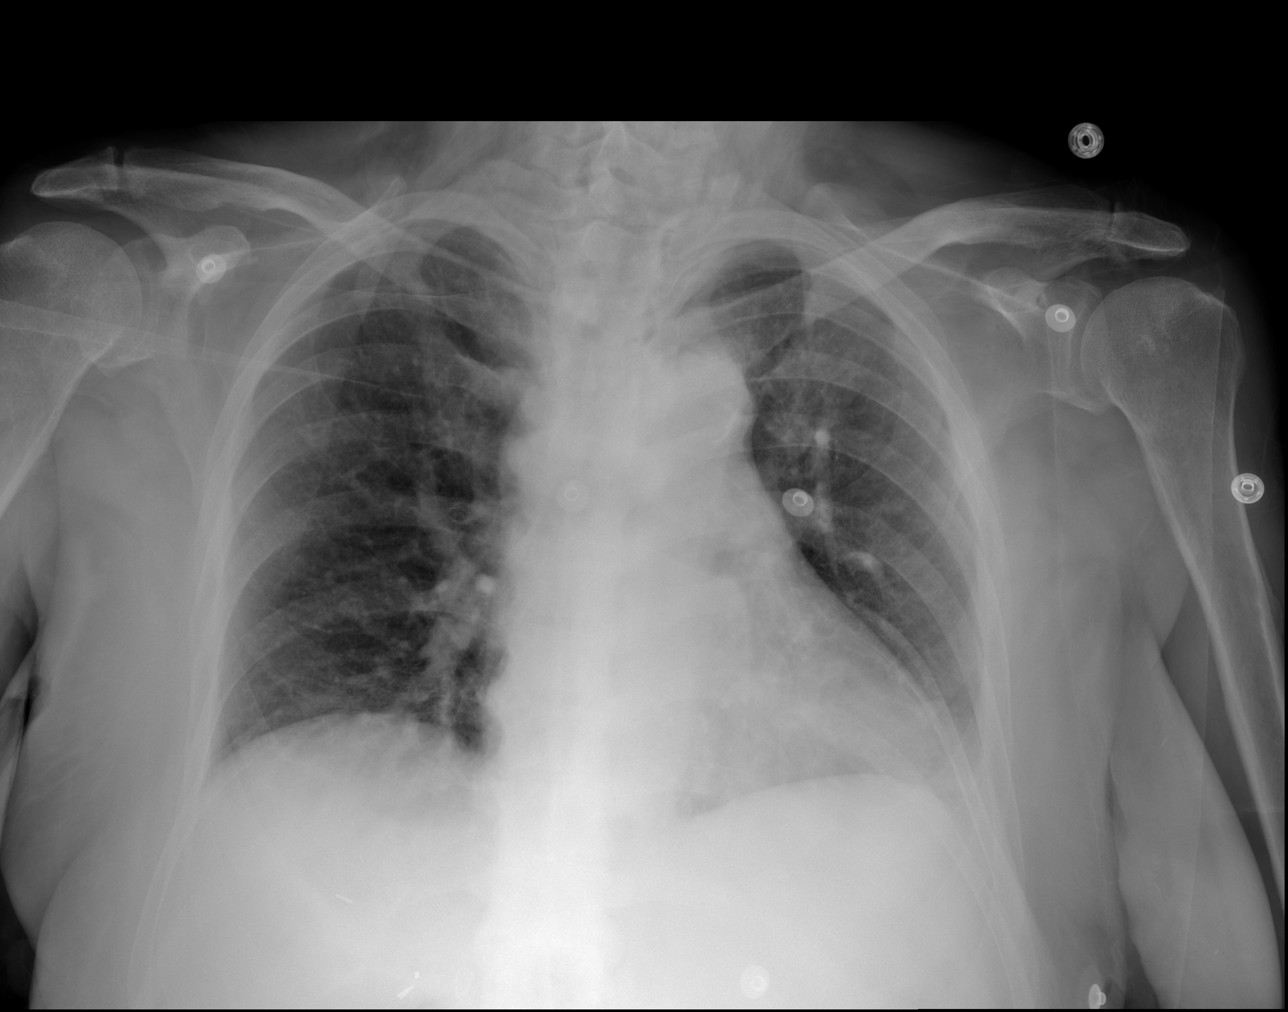

[x chest ap (2 of 4)]
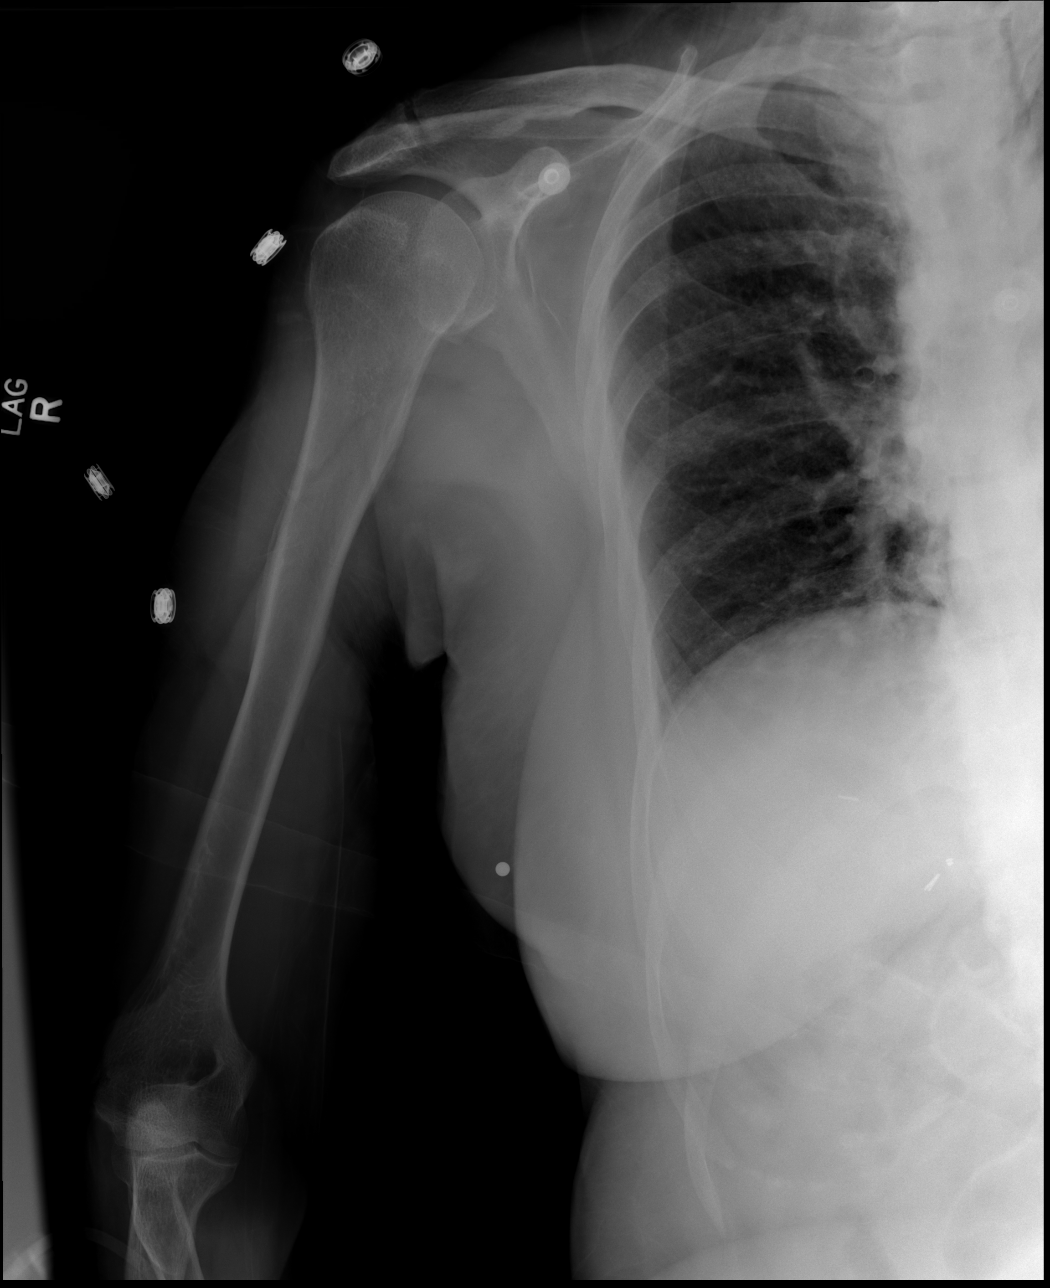

[x chest ap (3 of 4)]
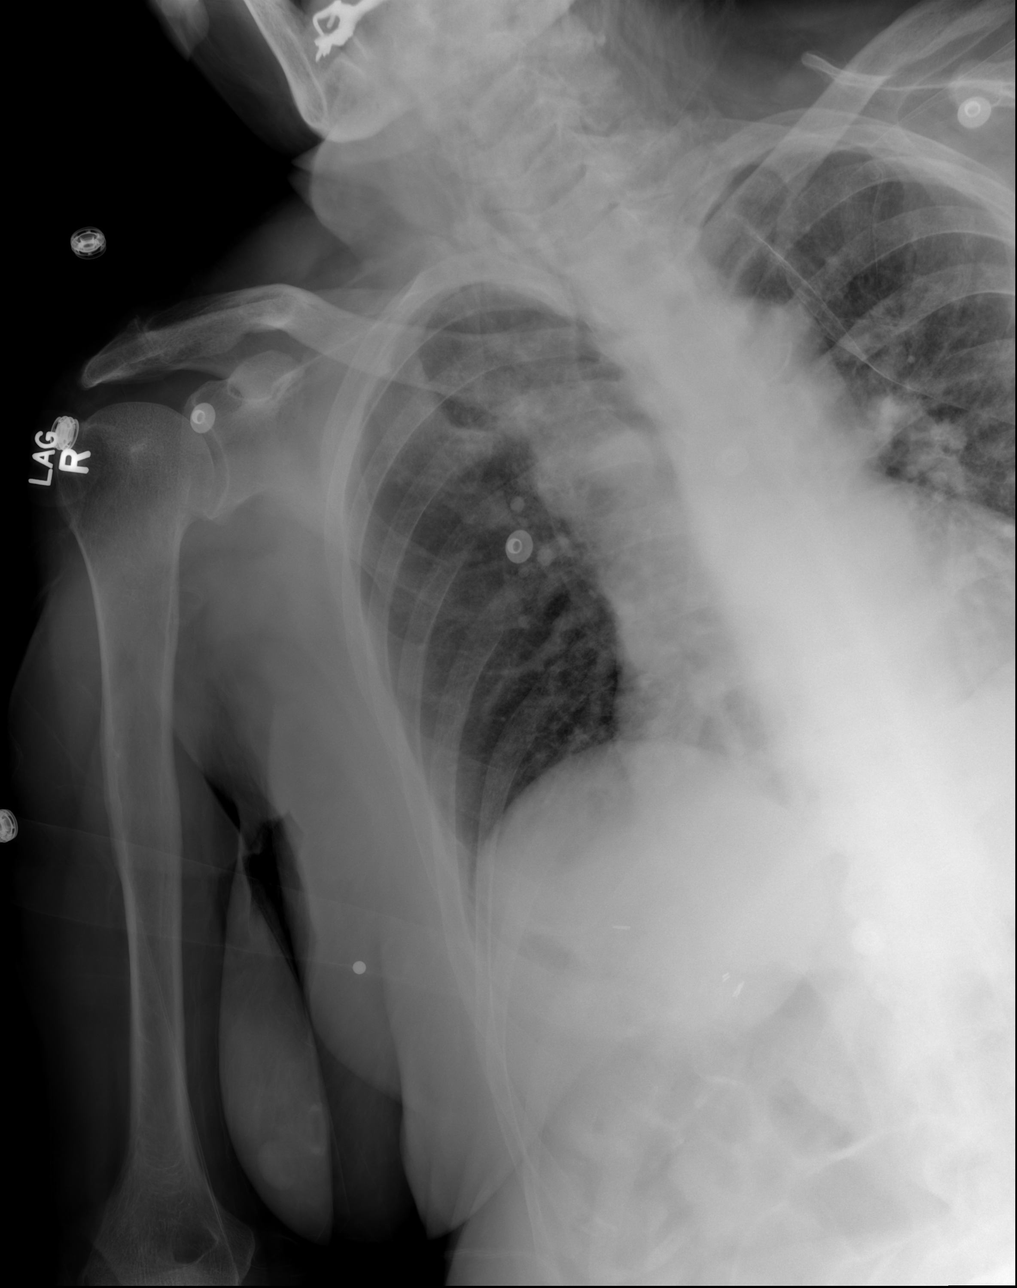

[x chest ap (4 of 4)]
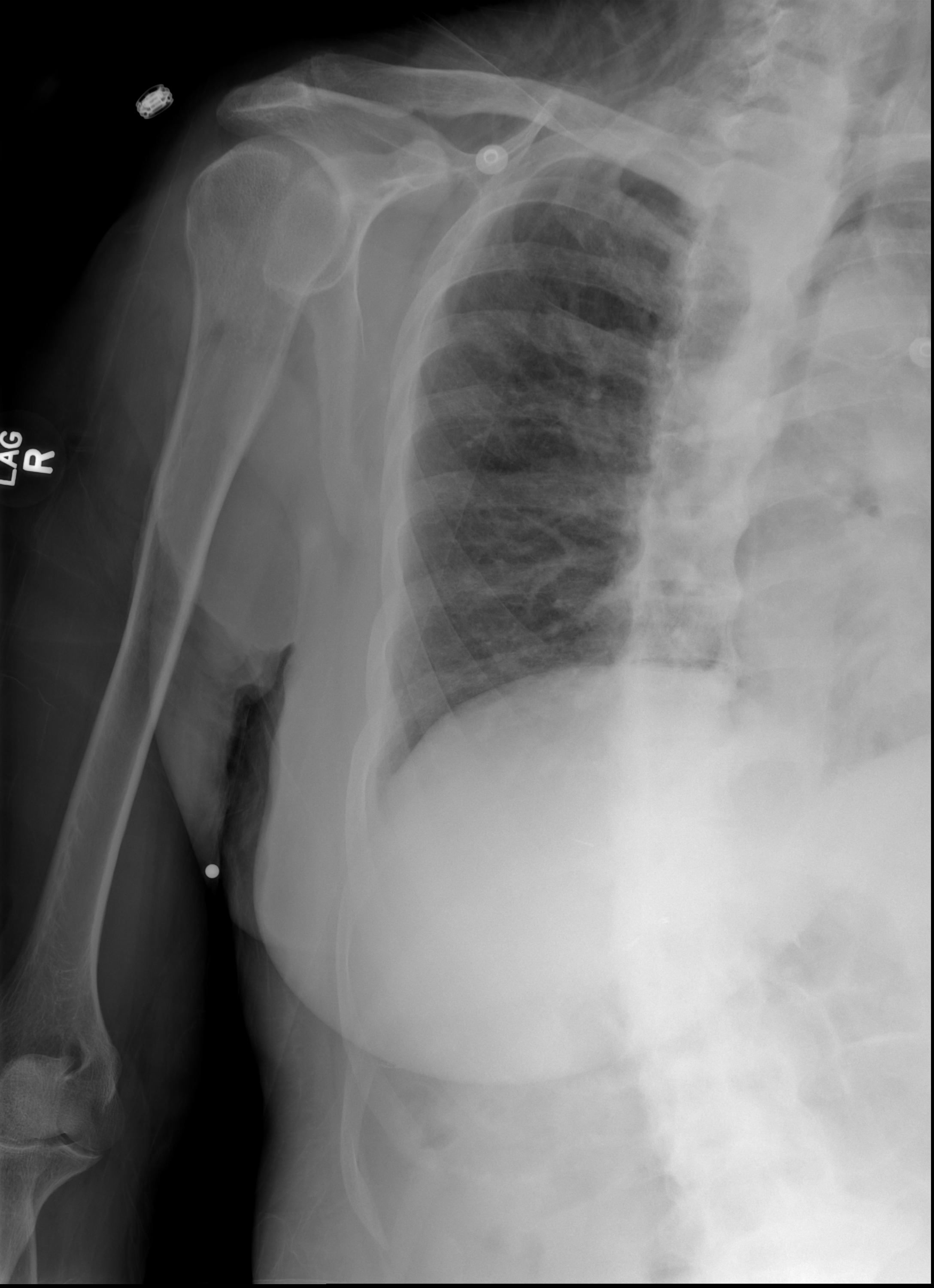

[4 of 4 positions shown; findings below may reference images not displayed]

FINDINGS: Shallow inspiration. Heart size and pulmonary vascularity are normal
for inspiratory effort. Calcification of the aorta. No focal
airspace disease or consolidation in the lungs. No blunting of
costophrenic angles. No pneumothorax.

Right ribs appear intact. No acute fracture or focal bone lesion
identified.
IMPRESSION: No evidence of active pulmonary disease. No acute displaced right
rib fractures identified.

## 2016-09-01 ENCOUNTER — Encounter (HOSPITAL_COMMUNITY): Payer: Self-pay | Admitting: *Deleted

## 2016-09-01 DIAGNOSIS — R930 Abnormal findings on diagnostic imaging of skull and head, not elsewhere classified: Secondary | ICD-10-CM | POA: Insufficient documentation

## 2016-09-01 DIAGNOSIS — Z87891 Personal history of nicotine dependence: Secondary | ICD-10-CM | POA: Insufficient documentation

## 2016-09-01 DIAGNOSIS — R531 Weakness: Secondary | ICD-10-CM | POA: Diagnosis present

## 2016-09-01 DIAGNOSIS — N189 Chronic kidney disease, unspecified: Secondary | ICD-10-CM | POA: Insufficient documentation

## 2016-09-01 DIAGNOSIS — I129 Hypertensive chronic kidney disease with stage 1 through stage 4 chronic kidney disease, or unspecified chronic kidney disease: Secondary | ICD-10-CM | POA: Diagnosis not present

## 2016-09-01 DIAGNOSIS — Z85038 Personal history of other malignant neoplasm of large intestine: Secondary | ICD-10-CM | POA: Diagnosis not present

## 2016-09-01 LAB — COMPREHENSIVE METABOLIC PANEL
ALT: 14 U/L (ref 14–54)
AST: 16 U/L (ref 15–41)
Albumin: 3.1 g/dL — ABNORMAL LOW (ref 3.5–5.0)
Alkaline Phosphatase: 96 U/L (ref 38–126)
Anion gap: 13 (ref 5–15)
BILIRUBIN TOTAL: 0.4 mg/dL (ref 0.3–1.2)
BUN: 61 mg/dL — AB (ref 6–20)
CHLORIDE: 118 mmol/L — AB (ref 101–111)
CO2: 9 mmol/L — ABNORMAL LOW (ref 22–32)
CREATININE: 9.01 mg/dL — AB (ref 0.44–1.00)
Calcium: 9.4 mg/dL (ref 8.9–10.3)
GFR calc Af Amer: 5 mL/min — ABNORMAL LOW (ref >60)
GFR, EST NON AFRICAN AMERICAN: 4 mL/min — AB (ref >60)
GLUCOSE: 112 mg/dL — AB (ref 65–99)
Potassium: 4.7 mmol/L (ref 3.5–5.1)
Sodium: 140 mmol/L (ref 135–145)
TOTAL PROTEIN: 5.7 g/dL — AB (ref 6.5–8.1)

## 2016-09-01 LAB — CBC WITH DIFFERENTIAL/PLATELET
BASOS ABS: 0 10*3/uL (ref 0.0–0.1)
Basophils Relative: 0 %
Eosinophils Absolute: 0.3 10*3/uL (ref 0.0–0.7)
Eosinophils Relative: 4 %
HEMATOCRIT: 28.2 % — AB (ref 36.0–46.0)
Hemoglobin: 9.1 g/dL — ABNORMAL LOW (ref 12.0–15.0)
LYMPHS PCT: 15 %
Lymphs Abs: 1 10*3/uL (ref 0.7–4.0)
MCH: 29.4 pg (ref 26.0–34.0)
MCHC: 32.3 g/dL (ref 30.0–36.0)
MCV: 91.3 fL (ref 78.0–100.0)
Monocytes Absolute: 0.6 10*3/uL (ref 0.1–1.0)
Monocytes Relative: 8 %
NEUTROS ABS: 5.1 10*3/uL (ref 1.7–7.7)
NEUTROS PCT: 73 %
Platelets: 137 10*3/uL — ABNORMAL LOW (ref 150–400)
RBC: 3.09 MIL/uL — AB (ref 3.87–5.11)
RDW: 17.5 % — ABNORMAL HIGH (ref 11.5–15.5)
WBC: 7 10*3/uL (ref 4.0–10.5)

## 2016-09-01 NOTE — ED Triage Notes (Signed)
Patient presents stating she has been having difficulty walking for several weeks  Patient unable to sit still in triage decreased appetite

## 2016-09-01 NOTE — ED Notes (Signed)
Unable to obtain a clean urine due to unsteady on her feet and confusion

## 2016-09-02 ENCOUNTER — Encounter (HOSPITAL_COMMUNITY): Payer: Self-pay | Admitting: *Deleted

## 2016-09-02 ENCOUNTER — Emergency Department (HOSPITAL_COMMUNITY): Payer: Medicare Other

## 2016-09-02 ENCOUNTER — Emergency Department (HOSPITAL_COMMUNITY)
Admission: EM | Admit: 2016-09-02 | Discharge: 2016-09-02 | Disposition: A | Discharge status: Home / Self Care | Payer: Medicare Other | Attending: Emergency Medicine | Admitting: Emergency Medicine

## 2016-09-02 DIAGNOSIS — I129 Hypertensive chronic kidney disease with stage 1 through stage 4 chronic kidney disease, or unspecified chronic kidney disease: Secondary | ICD-10-CM | POA: Diagnosis not present

## 2016-09-02 DIAGNOSIS — N189 Chronic kidney disease, unspecified: Secondary | ICD-10-CM

## 2016-09-02 DIAGNOSIS — R262 Difficulty in walking, not elsewhere classified: Secondary | ICD-10-CM

## 2016-09-02 DIAGNOSIS — N19 Unspecified kidney failure: Secondary | ICD-10-CM

## 2016-09-02 LAB — URINALYSIS, ROUTINE W REFLEX MICROSCOPIC
Bacteria, UA: NONE SEEN
Bilirubin Urine: NEGATIVE
GLUCOSE, UA: 50 mg/dL — AB
Hgb urine dipstick: NEGATIVE
KETONES UR: NEGATIVE mg/dL
Nitrite: NEGATIVE
PROTEIN: 100 mg/dL — AB
Specific Gravity, Urine: 1.004 — ABNORMAL LOW (ref 1.005–1.030)
pH: 6 (ref 5.0–8.0)

## 2016-09-02 NOTE — ED Notes (Signed)
PT at bedside.

## 2016-09-02 NOTE — ED Provider Notes (Addendum)
By signing my name below, I, Jeanell Sparrow, attest that this documentation has been prepared under the direction and in the presence of Solomon, DO . Electronically Signed: Jeanell Sparrow, Scribe. 09/02/2016. 2:49 AM.  TIME SEEN: 2:49 AM  CHIEF COMPLAINT: Difficulty walking  HPI:  HPI Comments: Tammy Hutchinson is a 70 y.o. female with history of hypertension, chronic kidney disease not yet on dialysis who presents to the Emergency Department complaining of constant moderate generalized weakness that started about a month ago. She states has been having difficulty ambulating due to worsening weakness. She reports no modifying factors. She has refused dialysis in the past but has a fistula in her LUE. She denies any chest pain, headache, neck or back pain, fever, abdominal pain, numbness/tingling, focal weakness, dysuria, vomiting, diarrhea, or other complaints. Husband reports he feels that she has been confused recently. She denies any falls, head injury recently. Husband reports he has a walker at home but patient refuses to use it.    ROS: See HPI Constitutional: +fever (subjective)  Eyes: no drainage  ENT: no runny nose   Cardiovascular:  no chest pain  Resp: no SOB  GI: no vomiting, no diarrhea GU: no dysuria Integumentary: no rash  Allergy: no hives  Musculoskeletal: no leg swelling  Neurological: +weakness, no numbness/tingling, no slurred speech ROS otherwise negative  PAST MEDICAL HISTORY/PAST SURGICAL HISTORY:  Past Medical History:  Diagnosis Date  . Cancer (HCC)    hx of colon cancer  . Chronic kidney disease   . Depression   . Hx of transfusion of packed red blood cells   . Hypertension    sees Dr. Corliss Parish [phone]    MEDICATIONS:  Prior to Admission medications   Medication Sig Start Date End Date Taking? Authorizing Provider  acetaminophen (TYLENOL) 325 MG tablet Take 325 mg by mouth every 6 (six) hours as needed for mild pain, moderate pain  or headache.    Historical Provider, MD  benztropine (COGENTIN) 1 MG tablet Take 1 mg by mouth at bedtime.      Historical Provider, MD  loxapine (LOXITANE) 10 MG capsule Take 10 mg by mouth at bedtime.      Historical Provider, MD    ALLERGIES:  No Known Allergies  SOCIAL HISTORY:  Social History  Substance Use Topics  . Smoking status: Former Smoker    Packs/day: 1.00    Years: 5.00    Types: Cigarettes    Quit date: 12/15/2002  . Smokeless tobacco: Never Used  . Alcohol use No    FAMILY HISTORY: Family History  Problem Relation Age of Onset  . Stroke Mother   . Heart disease Father   . Cancer Father     COLON    EXAM: BP 158/60 (BP Location: Right Arm)   Pulse 61   Temp 98.6 F (37 C) (Oral)   Resp 18   Wt 160 lb (72.6 kg)   SpO2 97%   BMI 27.90 kg/m  CONSTITUTIONAL: Alert and oriented x3 and responds appropriately to questions. Elderly and chronically ill-appearing; In no significant distress HEAD: Normocephalic EYES: Conjunctivae clear, PERRL, EOMI ENT: normal nose; no rhinorrhea; moist mucous membranes NECK: Supple, no meningismus, no nuchal rigidity, no LAD  CARD: RRR; S1 and S2 appreciated; no murmurs, no clicks, no rubs, no gallops RESP: Normal chest excursion without splinting or tachypnea; breath sounds clear and equal bilaterally; no wheezes, no rhonchi, no rales, no hypoxia or respiratory distress, speaking full sentences ABD/GI: Normal bowel  sounds; non-distended; soft, non-tender, no rebound, no guarding, no peritoneal signs, no hepatosplenomegaly BACK:  The back appears normal and is non-tender to palpation, there is no CVA tenderness, no midline spinal tenderness or step-off or deformity EXT: Normal ROM in all joints; non-tender to palpation; no edema; normal capillary refill; no cyanosis, no calf tenderness or swelling; Fistula in the left forearm with good thrill and bruit with no tenderness, erythema or warmth, swelling. 2+ radial pulses bilaterally.   SKIN: Normal color for age and race; warm; no rash NEURO: Moves all extremities equally, sensation to light touch intact diffusely, cranial nerves II through XII intact, normal speech, no drift, patient is able to stand without difficulty. Has a shuffling gait but no ataxia. PSYCH: The patient's mood and manner are appropriate. Grooming and personal hygiene are appropriate.  MEDICAL DECISION MAKING: Patient here with difficulty in dealing for the past month. I suspect that this is more chronic than that. Husband reports she has not been using the walker that they have provided her at home. She has not had any falls. Reports that she has refused to come to the hospital but he finally convinced her to come today. She has no focal neurologic deficits on exam. No signs of infection and she is afebrile. Rectal temperature normal. She does not appear volume overloaded. Intermittently hypertensive but this improved without medications. Will obtain a CT of her head, urinalysis. Labs done in triage show chronic anemia, worsening kidney function with uremia is causing metabolic acidosis but otherwise normal potassium. Patient is refusing admission at this time and is also refusing dialysis if needed.  ED PROGRESS: 4:30 AM  CT head shows no acute abnormality. EKG shows no new ischemic changes, arrhythmia. Urine shows no sign of infection other than trace leukocytes but no bacteria. Have discussed patient's case with Dr. Hal Hope, hospitalist on call. He recommends obtaining an MRI of her brain for further evaluation to rule out stroke and having physical therapy see the patient in the morning. If physical therapy recommends admission, he suggests that we call back in the morning for admission. Patient is still reluctant to be admitted to the hospital the husband at this time states he refuses to take her home until she has had her MRI and been evaluated by physical therapy. Patient is still adamant that she does not  want to be dialyzed and understands that there are risks of worsening kidney function and ultimately death without dialysis. She states she understands this but does not appear to be able to verbalize these risks back to me. She is oriented 3 and does not appear intoxicated.  Her mild confusion may be secondary to uremia. It is very difficult to ascertain from patient's husband if this is chronic or not. At this time other than uremia and metabolic acidosis I do not see any signs that she needs emergent dialysis. She is not extremely hypertensive, short of breath, hypoxia, volume overloaded, and she has a normal potassium.  Husband - Lon - cell [phone]   6:35 AM  Pt's MRI shows no acute abnormality. Exam had to be stopped early though because of patient's inability to cooperate.  I very low suspicion that this is a stroke as she has no focal deficits on exam and no ataxic gait. Both her and her husband refused placement in a rehabilitation or nursing facility. Has reports he would rather have her home where he can watch her closely. She still refuses dialysis. I do not think that  we can force her to do this and she doesn't have any reasons at this time for emergent dialysis. Have discussed with her that her mild confusion may be secondary to uremia have also discussed this with husband. It is hard to determine how chronic this confusion is however as patient's husband is also a poor historian. She agrees to wait for a physical therapy evaluation. At this time she still does not want admission to the hospital. Patient's husband reports that he can be contacted in the morning and will pick her up.  I will consult PT, CM, SW to help with any needs as an outpatient.   I reviewed all nursing notes, vitals, pertinent old records, EKGs, labs, imaging (as available).     EKG Interpretation  Date/Time:  Friday September 02 2016 04:24:10 EST Ventricular Rate:  71 PR Interval:    QRS Duration: 106 QT  Interval:  387 QTC Calculation: 421 R Axis:   62 Text Interpretation:  Sinus rhythm Rate is slower compared ot prior EKG Confirmed by Bayan Hedstrom,  DO, Kimble Delaurentis [id number]) on 09/02/2016 5:09:58 AM        I personally performed the services described in this documentation, which was scribed in my presence. The recorded information has been reviewed and is accurate.     [place], DO 09/02/16 [place], DO 09/02/16 [phone]

## 2016-09-02 NOTE — Discharge Planning (Signed)
I have recommended that this patient have [facility] but declines at this time. I have discussed the risks and benefits of this service. CM informed patient that if he changed his mind, her primary care physician (Dr. Birdie Riddle) can make arrangements from his office.The patient verbalizes understanding.

## 2016-09-02 NOTE — ED Notes (Signed)
Patient transported to MRI 

## 2016-09-02 NOTE — Discharge Instructions (Addendum)
Your labs today showed worsening kidney function. Please follow-up with your nephrologist. Otherwise your labs have showed no acute abnormality. Your urine showed no sign of infection. MRI of your brain showed no stroke.  Please use your walker at all times.    To find a primary care or specialty doctor (if needed) please call [phone] or [phone] to access "[facility] a Doctor Service."  You may also go on the [facility] website at [email or web address]  There are also multiple [facility], [facility], [facility] and [facility] practices throughout the [place] that are frequently accepting new patients. You may find a clinic that is close to your home and contact them.  [facility] [phone] [place]  [place] [address] [place] [phone]

## 2016-09-02 NOTE — Discharge Planning (Signed)
Fuller Mandril, RN, BSN, [place] [phone] Pt qualifies for DME rolling walker.  DME  ordered through [facility].  [place]e [place] of [facility] notified to deliver rolling walker to pt room prior to D/C home today.  Pt required PT and would like to have it in outpatient setting.  EDCM placed order for OPPT.

## 2016-09-02 NOTE — ED Notes (Signed)
Husband, [phone]

## 2016-09-02 NOTE — ED Notes (Signed)
Pt returned from MRI °

## 2016-09-02 NOTE — Progress Notes (Addendum)
CSW consult acknowledged re: home health needs. CSW has staffed with RN Case Manager re: home health assistance. CSW signing off. Please consult should new need(s) arise.    Lorrine Kin, MSW, LCSW [facility] ED/41M Clinical Social Worker [phone]

## 2016-09-02 NOTE — Evaluation (Signed)
Physical Therapy Evaluation Patient Details Name: Tammy Hutchinson MRN: [id number] DOB: [birth year] Today's Date: 09/02/2016   History of Present Illness  Patient is a 70 yo female presenting to ED on 09/02/16 with weakness and difficulty walking. Also noted confusion.  Patient with h/o multiple falls at home.   PMH:  HTN, CKD, depression  Clinical Impression  Patient presents with problems listed below.  Will benefit from acute PT to maximize functional independence prior to discharge home with husband.  Patient with general weakness and decreased balance impacting mobility, gait, and safety.  Recommend OP PT at d/c for continued therapy.    Follow Up Recommendations Outpatient PT;Supervision - Intermittent    Equipment Recommendations  Rolling walker with 5" wheels    Recommendations for Other Services       Precautions / Restrictions Precautions Precautions: Fall Precaution Comments: Falls pta Restrictions Weight Bearing Restrictions: No      Mobility  Bed Mobility Overal bed mobility: Needs Assistance Bed Mobility: Rolling;Sidelying to Sit;Sit to Supine Rolling: Min guard Sidelying to sit: Min assist   Sit to supine: Min guard   General bed mobility comments: Assist to raise trunk to upright sitting position.  Transfers Overall transfer level: Needs assistance Equipment used: None;Rolling walker (2 wheeled) Transfers: Sit to/from Stand Sit to Stand: Min guard         General transfer comment: Instructed patient on hand placement and technique for use of RW.  Assist for safety.  Patient stood from toilet with no assistive device, requiring assist for balance.  Ambulation/Gait Ambulation/Gait assistance: Min guard;Min assist Ambulation Distance (Feet): 160 Feet Assistive device: Rolling walker (2 wheeled);None Gait Pattern/deviations: Step-through pattern;Decreased stride length;Shuffle;Scissoring;Drifts right/left;Trunk flexed;Narrow base of support Gait  velocity: decreased Gait velocity interpretation: Below normal speed for age/gender General Gait Details: Verbal cues for safe use of RW.  Patient with unsteady gait, improved with RW.  However, continues to require assist to maneuver RW around obstacles in hallway.  Drifts left/right with RW.  No frank loss of balance with RW use.  Stairs Stairs:  (Instructed patient to have husband assist with stairs.)          Wheelchair Mobility    Modified Rankin (Stroke Patients Only)       Balance Overall balance assessment: Needs assistance;History of Falls         Standing balance support: No upper extremity supported;During functional activity Standing balance-Leahy Scale: Fair Standing balance comment: UE support and assist for balance during dynamic activities/gait.                             Pertinent Vitals/Pain Pain Assessment: No/denies pain    Home Living Family/patient expects to be discharged to:: Private residence Living Arrangements: Spouse/significant other Available Help at Discharge: Family;Available PRN/intermittently (Husband works) Type of Home: Mobile home Home Access: Stairs to enter Entrance Stairs-Rails: Psychiatric nurse of Steps: 6 Home Layout: One level Home Equipment: Other (comment) (Wide RW) Additional Comments: Wide RW too large to fit through doors easily.    Prior Function Level of Independence: Independent;Needs assistance   Gait / Transfers Assistance Needed: Patient reports she does not use her RW because "it's too big".  Holds on to furniture and walls during gait.  ADL's / Homemaking Assistance Needed: Husband assists with meal prep, housekeeping.  Comments: Patient drives - per husband "not a lot"     Hand Dominance        Extremity/Trunk  Assessment   Upper Extremity Assessment Upper Extremity Assessment: Generalized weakness    Lower Extremity Assessment Lower Extremity Assessment: Generalized  weakness    Cervical / Trunk Assessment Cervical / Trunk Assessment: Kyphotic  Communication   Communication: No difficulties (Difficult to understand at times)  Cognition Arousal/Alertness: Awake/alert Behavior During Therapy: WFL for tasks assessed/performed;Anxious;Impulsive Overall Cognitive Status: Impaired/Different from baseline Area of Impairment: Orientation;Attention;Memory;Safety/judgement;Awareness;Problem solving Orientation Level: Disoriented to;Time;Situation Current Attention Level: Sustained Memory: Decreased short-term memory   Safety/Judgement: Decreased awareness of deficits;Decreased awareness of safety   Problem Solving: Slow processing;Difficulty sequencing;Requires verbal cues General Comments: Difficulty providing PLOF and living situation information.  During gait, walks away from RW x3.  In bathroom, patient stepped away from toilet to complete pericare, losing balance x4 requiring assist to prevent fall.    General Comments      Exercises     Assessment/Plan    PT Assessment Patient needs continued PT services  PT Problem List Decreased strength;Decreased balance;Decreased mobility;Decreased cognition;Decreased knowledge of use of DME;Decreased safety awareness          PT Treatment Interventions DME instruction;Gait training;Stair training;Functional mobility training;Therapeutic activities;Therapeutic exercise;Balance training;Cognitive remediation;Patient/family education    PT Goals (Current goals can be found in the Care Plan section)  Acute Rehab PT Goals Patient Stated Goal: To go home PT Goal Formulation: With patient/family Time For Goal Achievement: 09/09/16 Potential to Achieve Goals: Good    Frequency Min 3X/week   Barriers to discharge Decreased caregiver support;Inaccessible home environment Husband works and patient alone during day.  Home has 6 steps to enter.    Co-evaluation               End of Session Equipment  Utilized During Treatment: Gait belt Activity Tolerance: Patient tolerated treatment well Patient left: in bed;with family/visitor present Nurse Communication: Mobility status    Functional Assessment Tool Used: Clinical judgement Functional Limitation: Mobility: Walking and moving around Mobility: Walking and Moving Around Current Status [id number]): At least 1 percent but less than 20 percent impaired, limited or restricted Mobility: Walking and Moving Around Goal Status [phone]): At least 1 percent but less than 20 percent impaired, limited or restricted Mobility: Walking and Moving Around Discharge Status [phone]): At least 1 percent but less than 20 percent impaired, limited or restricted    Time: 1050-1141 PT Time Calculation (min) (ACUTE ONLY): 51 min   Charges:   PT Evaluation $PT Eval Moderate Complexity: 1 Procedure PT Treatments $Gait Training: 8-22 mins $Therapeutic Activity: 8-22 mins   PT G Codes:   PT G-Codes **NOT FOR INPATIENT CLASS** Functional Assessment Tool Used: Clinical judgement Functional Limitation: Mobility: Walking and moving around Mobility: Walking and Moving Around Current Status [id number]): At least 1 percent but less than 20 percent impaired, limited or restricted Mobility: Walking and Moving Around Goal Status [phone]): At least 1 percent but less than 20 percent impaired, limited or restricted Mobility: Walking and Moving Around Discharge Status [phone]): At least 1 percent but less than 20 percent impaired, limited or restricted    Despina Pole 09/02/2016, 12:02 PM Carita Pian. Sanjuana Kava, [place] Pager [phone]

## 2016-09-02 NOTE — ED Notes (Signed)
Pt unable to void at this time. 

## 2016-09-02 NOTE — ED Notes (Addendum)
Patient refusing to keep monitors on. Pt. Wants to be 'let out of her cage' and go home.  Spoke with acute rehab about when PT would be out to see patient. They report that a therapist has been assigned and would be here when available. EDP notified

## 2016-09-11 ENCOUNTER — Emergency Department (HOSPITAL_COMMUNITY): Payer: Medicare Other

## 2016-09-11 ENCOUNTER — Inpatient Hospital Stay (HOSPITAL_COMMUNITY)
Admission: EM | Admit: 2016-09-11 | Discharge: 2016-10-06 | DRG: 082 | Disposition: E | Payer: Medicare Other | Attending: Internal Medicine | Admitting: Internal Medicine

## 2016-09-11 ENCOUNTER — Encounter (HOSPITAL_COMMUNITY): Payer: Self-pay | Admitting: *Deleted

## 2016-09-11 DIAGNOSIS — Z85038 Personal history of other malignant neoplasm of large intestine: Secondary | ICD-10-CM

## 2016-09-11 DIAGNOSIS — Z87891 Personal history of nicotine dependence: Secondary | ICD-10-CM

## 2016-09-11 DIAGNOSIS — W19XXXA Unspecified fall, initial encounter: Secondary | ICD-10-CM

## 2016-09-11 DIAGNOSIS — S12600A Unspecified displaced fracture of seventh cervical vertebra, initial encounter for closed fracture: Secondary | ICD-10-CM | POA: Diagnosis present

## 2016-09-11 DIAGNOSIS — Z823 Family history of stroke: Secondary | ICD-10-CM | POA: Diagnosis not present

## 2016-09-11 DIAGNOSIS — F319 Bipolar disorder, unspecified: Secondary | ICD-10-CM | POA: Diagnosis present

## 2016-09-11 DIAGNOSIS — J9600 Acute respiratory failure, unspecified whether with hypoxia or hypercapnia: Secondary | ICD-10-CM | POA: Diagnosis present

## 2016-09-11 DIAGNOSIS — I609 Nontraumatic subarachnoid hemorrhage, unspecified: Secondary | ICD-10-CM

## 2016-09-11 DIAGNOSIS — I120 Hypertensive chronic kidney disease with stage 5 chronic kidney disease or end stage renal disease: Secondary | ICD-10-CM | POA: Diagnosis present

## 2016-09-11 DIAGNOSIS — Z9049 Acquired absence of other specified parts of digestive tract: Secondary | ICD-10-CM | POA: Diagnosis not present

## 2016-09-11 DIAGNOSIS — R001 Bradycardia, unspecified: Secondary | ICD-10-CM | POA: Diagnosis not present

## 2016-09-11 DIAGNOSIS — Y92019 Unspecified place in single-family (private) house as the place of occurrence of the external cause: Secondary | ICD-10-CM | POA: Diagnosis not present

## 2016-09-11 DIAGNOSIS — Z800 Family history of malignant neoplasm of digestive organs: Secondary | ICD-10-CM | POA: Diagnosis not present

## 2016-09-11 DIAGNOSIS — Z515 Encounter for palliative care: Secondary | ICD-10-CM | POA: Diagnosis not present

## 2016-09-11 DIAGNOSIS — W1830XA Fall on same level, unspecified, initial encounter: Secondary | ICD-10-CM | POA: Diagnosis present

## 2016-09-11 DIAGNOSIS — Z9119 Patient's noncompliance with other medical treatment and regimen: Secondary | ICD-10-CM

## 2016-09-11 DIAGNOSIS — J811 Chronic pulmonary edema: Secondary | ICD-10-CM | POA: Diagnosis present

## 2016-09-11 DIAGNOSIS — Z9181 History of falling: Secondary | ICD-10-CM | POA: Diagnosis not present

## 2016-09-11 DIAGNOSIS — Z8249 Family history of ischemic heart disease and other diseases of the circulatory system: Secondary | ICD-10-CM | POA: Diagnosis not present

## 2016-09-11 DIAGNOSIS — S066XAA Traumatic subarachnoid hemorrhage with loss of consciousness status unknown, initial encounter: Secondary | ICD-10-CM

## 2016-09-11 DIAGNOSIS — R296 Repeated falls: Secondary | ICD-10-CM | POA: Diagnosis present

## 2016-09-11 DIAGNOSIS — Z79899 Other long term (current) drug therapy: Secondary | ICD-10-CM | POA: Diagnosis not present

## 2016-09-11 DIAGNOSIS — S066X4A Traumatic subarachnoid hemorrhage with loss of consciousness of 6 hours to 24 hours, initial encounter: Secondary | ICD-10-CM

## 2016-09-11 DIAGNOSIS — R578 Other shock: Secondary | ICD-10-CM | POA: Diagnosis not present

## 2016-09-11 DIAGNOSIS — R402423 Glasgow coma scale score 9-12, at hospital admission: Secondary | ICD-10-CM | POA: Diagnosis present

## 2016-09-11 DIAGNOSIS — S066X9A Traumatic subarachnoid hemorrhage with loss of consciousness of unspecified duration, initial encounter: Secondary | ICD-10-CM | POA: Diagnosis present

## 2016-09-11 DIAGNOSIS — S064X9A Epidural hemorrhage with loss of consciousness of unspecified duration, initial encounter: Secondary | ICD-10-CM | POA: Diagnosis present

## 2016-09-11 DIAGNOSIS — S066X4S Traumatic subarachnoid hemorrhage with loss of consciousness of 6 hours to 24 hours, sequela: Secondary | ICD-10-CM | POA: Diagnosis not present

## 2016-09-11 DIAGNOSIS — D631 Anemia in chronic kidney disease: Secondary | ICD-10-CM | POA: Diagnosis present

## 2016-09-11 DIAGNOSIS — Z66 Do not resuscitate: Secondary | ICD-10-CM | POA: Diagnosis not present

## 2016-09-11 DIAGNOSIS — N186 End stage renal disease: Secondary | ICD-10-CM | POA: Diagnosis present

## 2016-09-11 DIAGNOSIS — J189 Pneumonia, unspecified organism: Secondary | ICD-10-CM | POA: Diagnosis present

## 2016-09-11 LAB — BLOOD CULTURE ID PANEL (REFLEXED)
Acinetobacter baumannii: NOT DETECTED
Candida albicans: NOT DETECTED
Candida glabrata: NOT DETECTED
Candida krusei: NOT DETECTED
Candida parapsilosis: NOT DETECTED
Candida tropicalis: NOT DETECTED
ENTEROCOCCUS SPECIES: NOT DETECTED
Enterobacter cloacae complex: NOT DETECTED
Enterobacteriaceae species: NOT DETECTED
Escherichia coli: NOT DETECTED
HAEMOPHILUS INFLUENZAE: NOT DETECTED
Klebsiella oxytoca: NOT DETECTED
Klebsiella pneumoniae: NOT DETECTED
LISTERIA MONOCYTOGENES: NOT DETECTED
NEISSERIA MENINGITIDIS: NOT DETECTED
PSEUDOMONAS AERUGINOSA: NOT DETECTED
Proteus species: NOT DETECTED
SERRATIA MARCESCENS: NOT DETECTED
STAPHYLOCOCCUS AUREUS BCID: NOT DETECTED
STREPTOCOCCUS PNEUMONIAE: DETECTED — AB
STREPTOCOCCUS PYOGENES: NOT DETECTED
STREPTOCOCCUS SPECIES: DETECTED — AB
Staphylococcus species: NOT DETECTED
Streptococcus agalactiae: NOT DETECTED

## 2016-09-11 LAB — BASIC METABOLIC PANEL
ANION GAP: 8 (ref 5–15)
BUN: 76 mg/dL — ABNORMAL HIGH (ref 6–20)
CALCIUM: 8.7 mg/dL — AB (ref 8.9–10.3)
CO2: 10 mmol/L — ABNORMAL LOW (ref 22–32)
Chloride: 121 mmol/L — ABNORMAL HIGH (ref 101–111)
Creatinine, Ser: 8.82 mg/dL — ABNORMAL HIGH (ref 0.44–1.00)
GFR calc non Af Amer: 4 mL/min — ABNORMAL LOW (ref >60)
GFR, EST AFRICAN AMERICAN: 5 mL/min — AB (ref >60)
Glucose, Bld: 126 mg/dL — ABNORMAL HIGH (ref 65–99)
POTASSIUM: 4.6 mmol/L (ref 3.5–5.1)
SODIUM: 139 mmol/L (ref 135–145)

## 2016-09-11 LAB — CBC WITH DIFFERENTIAL/PLATELET
BASOS PCT: 0 %
Basophils Absolute: 0 10*3/uL (ref 0.0–0.1)
EOS PCT: 0 %
Eosinophils Absolute: 0 10*3/uL (ref 0.0–0.7)
HEMATOCRIT: 22.7 % — AB (ref 36.0–46.0)
Hemoglobin: 7.6 g/dL — ABNORMAL LOW (ref 12.0–15.0)
Lymphocytes Relative: 4 %
Lymphs Abs: 0.4 10*3/uL — ABNORMAL LOW (ref 0.7–4.0)
MCH: 29.9 pg (ref 26.0–34.0)
MCHC: 33.5 g/dL (ref 30.0–36.0)
MCV: 89.4 fL (ref 78.0–100.0)
MONOS PCT: 5 %
Monocytes Absolute: 0.5 10*3/uL (ref 0.1–1.0)
NEUTROS ABS: 8.2 10*3/uL — AB (ref 1.7–7.7)
Neutrophils Relative %: 91 %
Platelets: 148 10*3/uL — ABNORMAL LOW (ref 150–400)
RBC: 2.54 MIL/uL — AB (ref 3.87–5.11)
RDW: 18 % — ABNORMAL HIGH (ref 11.5–15.5)
WBC: 9.1 10*3/uL (ref 4.0–10.5)

## 2016-09-11 LAB — RAPID URINE DRUG SCREEN, HOSP PERFORMED
Amphetamines: NOT DETECTED
Barbiturates: NOT DETECTED
Benzodiazepines: NOT DETECTED
Cocaine: NOT DETECTED
Opiates: NOT DETECTED
TETRAHYDROCANNABINOL: NOT DETECTED

## 2016-09-11 LAB — COMPREHENSIVE METABOLIC PANEL
ALT: 11 U/L — ABNORMAL LOW (ref 14–54)
AST: 23 U/L (ref 15–41)
Albumin: 2.5 g/dL — ABNORMAL LOW (ref 3.5–5.0)
Alkaline Phosphatase: 84 U/L (ref 38–126)
Anion gap: 12 (ref 5–15)
BILIRUBIN TOTAL: 0.9 mg/dL (ref 0.3–1.2)
BUN: 79 mg/dL — AB (ref 6–20)
CO2: 10 mmol/L — ABNORMAL LOW (ref 22–32)
CREATININE: 9.18 mg/dL — AB (ref 0.44–1.00)
Calcium: 9 mg/dL (ref 8.9–10.3)
Chloride: 117 mmol/L — ABNORMAL HIGH (ref 101–111)
GFR, EST AFRICAN AMERICAN: 4 mL/min — AB (ref >60)
GFR, EST NON AFRICAN AMERICAN: 4 mL/min — AB (ref >60)
Glucose, Bld: 107 mg/dL — ABNORMAL HIGH (ref 65–99)
POTASSIUM: 4.4 mmol/L (ref 3.5–5.1)
Sodium: 139 mmol/L (ref 135–145)
TOTAL PROTEIN: 4.9 g/dL — AB (ref 6.5–8.1)

## 2016-09-11 LAB — POCT I-STAT 3, ART BLOOD GAS (G3+)
ACID-BASE DEFICIT: 18 mmol/L — AB (ref 0.0–2.0)
Bicarbonate: 9.6 mmol/L — ABNORMAL LOW (ref 20.0–28.0)
O2 SAT: 99 %
PCO2 ART: 26 mmHg — AB (ref 32.0–48.0)
PH ART: 7.17 — AB (ref 7.350–7.450)
PO2 ART: 181 mmHg — AB (ref 83.0–108.0)
Patient temperature: 35.6
TCO2: 10 mmol/L (ref 0–100)

## 2016-09-11 LAB — I-STAT ARTERIAL BLOOD GAS, ED
Acid-base deficit: 18 mmol/L — ABNORMAL HIGH (ref 0.0–2.0)
Bicarbonate: 10.3 mmol/L — ABNORMAL LOW (ref 20.0–28.0)
O2 Saturation: 100 %
PCO2 ART: 30.6 mmHg — AB (ref 32.0–48.0)
PH ART: 7.116 — AB (ref 7.350–7.450)
Patient temperature: 93.3
TCO2: 11 mmol/L (ref 0–100)
pO2, Arterial: 332 mmHg — ABNORMAL HIGH (ref 83.0–108.0)

## 2016-09-11 LAB — I-STAT TROPONIN, ED: TROPONIN I, POC: 0.08 ng/mL (ref 0.00–0.08)

## 2016-09-11 LAB — URINALYSIS, ROUTINE W REFLEX MICROSCOPIC
Bilirubin Urine: NEGATIVE
GLUCOSE, UA: NEGATIVE mg/dL
HGB URINE DIPSTICK: NEGATIVE
Ketones, ur: NEGATIVE mg/dL
Leukocytes, UA: NEGATIVE
NITRITE: NEGATIVE
Protein, ur: 100 mg/dL — AB
SPECIFIC GRAVITY, URINE: 1.006 (ref 1.005–1.030)
Squamous Epithelial / LPF: NONE SEEN
pH: 5 (ref 5.0–8.0)

## 2016-09-11 LAB — GLUCOSE, CAPILLARY
GLUCOSE-CAPILLARY: 70 mg/dL (ref 65–99)
Glucose-Capillary: 69 mg/dL (ref 65–99)

## 2016-09-11 LAB — MAGNESIUM: MAGNESIUM: 1.5 mg/dL — AB (ref 1.7–2.4)

## 2016-09-11 LAB — MRSA PCR SCREENING: MRSA by PCR: NEGATIVE

## 2016-09-11 LAB — AMMONIA: AMMONIA: 28 umol/L (ref 9–35)

## 2016-09-11 LAB — CBG MONITORING, ED: GLUCOSE-CAPILLARY: 99 mg/dL (ref 65–99)

## 2016-09-11 LAB — PHOSPHORUS: PHOSPHORUS: 7.9 mg/dL — AB (ref 2.5–4.6)

## 2016-09-11 LAB — BRAIN NATRIURETIC PEPTIDE: B NATRIURETIC PEPTIDE 5: 1524.3 pg/mL — AB (ref 0.0–100.0)

## 2016-09-11 LAB — I-STAT CG4 LACTIC ACID, ED

## 2016-09-11 MED ORDER — DOPAMINE-DEXTROSE 3.2-5 MG/ML-% IV SOLN
0.0000 ug/kg/min | INTRAVENOUS | Status: DC
  Administered 2016-09-11: 5 ug/kg/min via INTRAVENOUS
  Filled 2016-09-11: qty 250

## 2016-09-11 MED ORDER — PIPERACILLIN-TAZOBACTAM IN DEX 2-0.25 GM/50ML IV SOLN
2.2500 g | Freq: Three times a day (TID) | INTRAVENOUS | Status: DC
  Administered 2016-09-11: 2.25 g via INTRAVENOUS
  Filled 2016-09-11 (×4): qty 50

## 2016-09-11 MED ORDER — ROCURONIUM BROMIDE 50 MG/5ML IV SOLN
40.0000 mg | Freq: Once | INTRAVENOUS | Status: AC
  Administered 2016-09-11: 40 mg via INTRAVENOUS
  Filled 2016-09-11: qty 4

## 2016-09-11 MED ORDER — ORAL CARE MOUTH RINSE
15.0000 mL | Freq: Four times a day (QID) | OROMUCOSAL | Status: DC
  Administered 2016-09-11 – 2016-09-12 (×3): 15 mL via OROMUCOSAL

## 2016-09-11 MED ORDER — PHENYLEPHRINE 40 MCG/ML (10ML) SYRINGE FOR IV PUSH (FOR BLOOD PRESSURE SUPPORT)
PREFILLED_SYRINGE | INTRAVENOUS | Status: AC
  Filled 2016-09-11: qty 10

## 2016-09-11 MED ORDER — CHLORHEXIDINE GLUCONATE 0.12% ORAL RINSE (MEDLINE KIT)
15.0000 mL | Freq: Two times a day (BID) | OROMUCOSAL | Status: DC
  Administered 2016-09-11 – 2016-09-12 (×2): 15 mL via OROMUCOSAL

## 2016-09-11 MED ORDER — FUROSEMIDE 10 MG/ML IJ SOLN
4.0000 mg/h | INTRAVENOUS | Status: DC
  Administered 2016-09-11: 4 mg/h via INTRAVENOUS
  Filled 2016-09-11: qty 25

## 2016-09-11 MED ORDER — SODIUM CHLORIDE 0.9 % IV SOLN
250.0000 mL | INTRAVENOUS | Status: DC | PRN
Start: 2016-09-11 — End: 2016-09-12

## 2016-09-11 MED ORDER — VANCOMYCIN HCL 10 G IV SOLR
1250.0000 mg | Freq: Once | INTRAVENOUS | Status: AC
  Administered 2016-09-11: 1250 mg via INTRAVENOUS
  Filled 2016-09-11: qty 1250

## 2016-09-11 MED ORDER — PIPERACILLIN-TAZOBACTAM 3.375 G IVPB 30 MIN
3.3750 g | Freq: Once | INTRAVENOUS | Status: AC
  Administered 2016-09-11: 3.375 g via INTRAVENOUS
  Filled 2016-09-11: qty 50

## 2016-09-11 MED ORDER — FENTANYL 2500MCG IN NS 250ML (10MCG/ML) PREMIX INFUSION
25.0000 ug/h | INTRAVENOUS | Status: DC
  Administered 2016-09-11: 10 ug/h via INTRAVENOUS
  Filled 2016-09-11: qty 250

## 2016-09-11 MED ORDER — FLUMAZENIL 0.5 MG/5ML IV SOLN: 0.2000 mg | Freq: Once | INTRAVENOUS | Status: DC

## 2016-09-11 MED ORDER — PIPERACILLIN-TAZOBACTAM 3.375 G IVPB: 3.3750 g | Freq: Two times a day (BID) | INTRAVENOUS | Status: DC

## 2016-09-11 MED ORDER — ETOMIDATE 2 MG/ML IV SOLN
20.0000 mg | Freq: Once | INTRAVENOUS | Status: AC
  Administered 2016-09-11: 20 mg via INTRAVENOUS

## 2016-09-11 MED ORDER — DEXTROSE 5 % IV SOLN
2.0000 g | INTRAVENOUS | Status: DC
  Administered 2016-09-11: 2 g via INTRAVENOUS
  Filled 2016-09-11 (×2): qty 2

## 2016-09-11 MED ORDER — PANTOPRAZOLE SODIUM 40 MG IV SOLR
40.0000 mg | Freq: Every day | INTRAVENOUS | Status: DC
  Administered 2016-09-11: 40 mg via INTRAVENOUS
  Filled 2016-09-11: qty 40

## 2016-09-11 MED ORDER — LORAZEPAM 2 MG/ML IJ SOLN
0.5000 mg | Freq: Once | INTRAMUSCULAR | Status: AC
  Administered 2016-09-11: 0.5 mg via INTRAVENOUS
  Filled 2016-09-11: qty 1

## 2016-09-11 MED ORDER — DEXTROSE 5 % IV SOLN
0.0000 ug/min | INTRAVENOUS | Status: DC
  Administered 2016-09-11: 25 ug/min via INTRAVENOUS
  Filled 2016-09-11 (×2): qty 1

## 2016-09-11 MED ORDER — FLUMAZENIL 0.5 MG/5ML IV SOLN
INTRAVENOUS | Status: AC
  Filled 2016-09-11: qty 5

## 2016-09-11 MED ORDER — SODIUM CHLORIDE 0.9 % IV BOLUS (SEPSIS)
1000.0000 mL | Freq: Once | INTRAVENOUS | Status: AC
  Administered 2016-09-11: 1000 mL via INTRAVENOUS

## 2016-09-11 NOTE — ED Notes (Signed)
Husband leaving. Phone number correct in computer

## 2016-09-11 NOTE — ED Notes (Signed)
Patient transported to MRI with this RN, RT and transporter

## 2016-09-11 NOTE — ED Notes (Signed)
MD at the bedside  

## 2016-09-11 NOTE — ED Provider Notes (Signed)
I received this patient in signout from Dr. Betsey Holiday. We were awaiting MR imaging and further recs from [facility] and CCM. MR showed 3 column fx w/ associated epidural hematoma and cord flattening. Dr. Sharmaine Base, neurosurgery, discussed plans with the patient's husband who wants all medical therapy. I contacted critical care and discussed with Dr. Titus Mould. I also paged palliative care. Palliative care evaluated the patient and discussed poor prognosis at length with the patient's husband. He is adamant about patient being full code with all medical therapy. Contacted Dr. Titus Mould again. Pt will be admitted to MICU for further care.  CRITICAL CARE Performed by: Wenda Overland Donnis Phaneuf   Total critical care time: 45 minutes  Critical care time was exclusive of separately billable procedures and treating other patients.  Critical care was necessary to treat or prevent imminent or life-threatening deterioration.  Critical care was time spent personally by me on the following activities: development of treatment plan with patient and/or surrogate as well as nursing, discussions with consultants, evaluation of patient's response to treatment, examination of patient, obtaining history from patient or surrogate, ordering and performing treatments and interventions, ordering and review of laboratory studies, ordering and review of radiographic studies, pulse oximetry and re-evaluation of patient's condition.    Sharlett Iles, MD 09/20/2016 [phone]

## 2016-09-11 NOTE — ED Notes (Signed)
Dr. Betsey Holiday made aware pt is back in 26. Responds to painful stimuli, sternal rub only. Pt has a head bleed according to CT

## 2016-09-11 NOTE — ED Notes (Signed)
Preparing for intubation, RT at the bedside, charge at bedside.

## 2016-09-11 NOTE — Progress Notes (Signed)
[facility] Progress Note Patient Name: Tammy Hutchinson DOB: [birth year] MRN: [id number]   Date of Service  09/06/2016  HPI/Events of Note  ED calls [facility] to let PCCM aware of a possiblle consult.   Patient with several comorbidities (colon CA, ESRD), repeated ED visits for fall presents with confusion and fall again.  Patient has refused hemodialysis.  Has also gone AMA in the past.   Cranial CT scan with acute subdural hematoma as well as IVH. Cervical scan shows cervical fracture as well.  Chest x-ray with possible infiltrate in the left base.  Patient is intubated. Hypotensive, getting IV fluids.   Dr. Betsey Holiday spoke to neurosurgery Dr. Cyndy Freeze.  Neurosurgery wants  MRI. If it is aneurysmal bleed, nothing much neurosurgery wise can be done as patient's creatinine is elevated.  Neurosurgery will decide on the next course of action. If patient remains poor prognosis without much intervention, he will discuss goals of care. Not sure if husband will switch patient to comfort care.   Advised ED to give IV fluids, start broad-spectrum antibiotics for healthcare associated pneumonia. Neurosurgery wanted map [phone]. May need pressors. Cont  ventilatory support.   eICU Interventions  I advised ER to call P CCM back after MRI/MRA has been done and neurosurgery has decided on patient's case.      Intervention Category Evaluation Type: New Patient Evaluation  Rush Landmark 09/14/2016, 6:45 AM

## 2016-09-11 NOTE — ED Provider Notes (Signed)
[place] DEPT Provider Note   CSN: [id number] Arrival date & time: 09/23/2016  0134  By signing my name below, I, Arianna Nassar, attest that this documentation has been prepared under the direction and in the presence of Orpah Greek, MD.  Electronically Signed: Julien Nordmann, ED Scribe. 09/22/2016. 2:03 AM.    History   Chief Complaint Chief Complaint  Patient presents with  . Altered Mental Status    LEVEL V CAVEAT: HPI and ROS limited due to altered mental status   The history is provided by the EMS personnel. No language interpreter was used.   HPI Comments: Tammy Hutchinson is a 71 y.o. female brought in by ambulance, who is non-verbal and has a PMhx of colon cancer, CKD, depression, HTN, and ESRD presents to the Emergency Department presenting with altered mental status onset this evening. Husband states pt has been more confused than normal. She has been having difficulty ambulating and has been having aphasia for the past week. It is unknown of the pt's last seen normal. The pt was seen on 09/02/16 for the same presentation of symptoms where she was observed and had physical therapy to assess patient. At that moment in time, pt was still unable to speak or ambulate. There are no other complaints.  Past Medical History:  Diagnosis Date  . Cancer (HCC)    hx of colon cancer  . Chronic kidney disease   . Depression   . Hx of transfusion of packed red blood cells   . Hypertension    sees Dr. Corliss Parish [phone]    Patient Active Problem List   Diagnosis Date Noted  . HTN (hypertension) 11/06/2012  . End stage renal disease ([place]) 12/15/2011  . General medical examination 09/27/2011  . Encounter for long-term (current) use of high-risk medication 07/15/2011  . MOOD DISORDER 10/24/2008  . RENAL INSUFFICIENCY 09/02/2008  . DIARRHEA 07/25/2008  . COLON CANCER 09/05/1978    Past Surgical History:  Procedure Laterality Date  . APPENDECTOMY    . AV  FISTULA PLACEMENT    . BVT  04/03/12   BVT  Left Bascilic Vein Transposition  . CHOLECYSTECTOMY    . COLON SURGERY     approx. 10 years ago    OB History    No data available       Home Medications    Prior to Admission medications   Medication Sig Start Date End Date Taking? Authorizing Provider  benztropine (COGENTIN) 1 MG tablet Take 1 mg by mouth at bedtime.      Historical Provider, MD  BYSTOLIC 5 MG tablet Take 5 mg by mouth every evening. 07/09/16   Historical Provider, MD  diphenoxylate-atropine (LOMOTIL) 2.5-0.025 MG tablet Take 1 tablet by mouth 4 (four) times daily as needed for diarrhea or loose stools.  07/14/16   Historical Provider, MD  loxapine (LOXITANE) 10 MG capsule Take 10 mg by mouth at bedtime.      Historical Provider, MD    Family History Family History  Problem Relation Age of Onset  . Stroke Mother   . Heart disease Father   . Cancer Father     COLON    Social History Social History  Substance Use Topics  . Smoking status: Former Smoker    Packs/day: 1.00    Years: 5.00    Types: Cigarettes    Quit date: 12/15/2002  . Smokeless tobacco: Never Used  . Alcohol use No     Allergies   Patient has  no known allergies.   Review of Systems Review of Systems  All other systems reviewed and are negative.  LEVEL V CAVEAT: HPI and ROS limited due to altered mental status    Physical Exam Updated Vital Signs BP (!) 99/45 (BP Location: Right Leg)   Pulse (!) 46   Resp 18   SpO2 98%   Physical Exam  Constitutional: She appears well-developed and well-nourished. No distress.  HENT:  Head: Normocephalic and atraumatic.  Right Ear: Hearing normal.  Left Ear: Hearing normal.  Nose: Nose normal.  Mouth/Throat: Oropharynx is clear and moist and mucous membranes are normal.  Eyes: Conjunctivae and EOM are normal. Pupils are equal, round, and reactive to light.  Neck: Normal range of motion. Neck supple.  Cardiovascular: Regular rhythm, S1  normal and S2 normal.  Exam reveals no gallop and no friction rub.   No murmur heard. Pulmonary/Chest: Effort normal and breath sounds normal. No respiratory distress. She exhibits no tenderness.  Abdominal: Soft. Normal appearance and bowel sounds are normal. There is no hepatosplenomegaly. There is no tenderness. There is no rebound, no guarding, no tenderness at McBurney's point and negative Murphy's sign. No hernia.  Musculoskeletal: Normal range of motion.  Neurological: She is alert. She has normal strength. No cranial nerve deficit or sensory deficit. Coordination normal. GCS eye subscore is 4. GCS verbal subscore is 5. GCS motor subscore is 6.  Non-communicative; moving all extremities but not following commands  Skin: Skin is warm, dry and intact. No rash noted. No cyanosis.  Psychiatric: Thought content normal. She is agitated. She is noncommunicative.  agitated  Nursing note and vitals reviewed.    ED Treatments / Results  COORDINATION OF CARE:  2:01 AM Discussed treatment plan with pt at bedside and pt agreed to plan.  Labs (all labs ordered are listed, but only abnormal results are displayed) Labs Reviewed  CBC WITH DIFFERENTIAL/PLATELET - Abnormal; Notable for the following:       Result Value   RBC 2.54 (*)    Hemoglobin 7.6 (*)    HCT 22.7 (*)    RDW 18.0 (*)    Platelets 148 (*)    Neutro Abs 8.2 (*)    Lymphs Abs 0.4 (*)    All other components within normal limits  COMPREHENSIVE METABOLIC PANEL - Abnormal; Notable for the following:    Chloride 117 (*)    CO2 10 (*)    Glucose, Bld 107 (*)    BUN 79 (*)    Creatinine, Ser 9.18 (*)    Total Protein 4.9 (*)    Albumin 2.5 (*)    ALT 11 (*)    GFR calc non Af Amer 4 (*)    GFR calc Af Amer 4 (*)    All other components within normal limits  URINALYSIS, ROUTINE W REFLEX MICROSCOPIC - Abnormal; Notable for the following:    Protein, ur 100 (*)    Bacteria, UA RARE (*)    All other components within normal  limits  CULTURE, BLOOD (ROUTINE X 2)  CULTURE, BLOOD (ROUTINE X 2)  URINE CULTURE  RAPID URINE DRUG SCREEN, HOSP PERFORMED  AMMONIA  CBG MONITORING, ED  I-STAT TROPOININ, ED  I-STAT CG4 LACTIC ACID, ED    EKG  EKG Interpretation  Date/Time:  Sunday September 11 2016 02:04:18 EST Ventricular Rate:  50 PR Interval:    QRS Duration: 110 QT Interval:  509 QTC Calculation: 465 R Axis:   73 Text Interpretation:  Sinus  rhythm Otherwise within normal limits Confirmed by [facility]  MD, Pershing Skidmore [phone]) on 09/06/2016 5:47:43 AM       Radiology Ct Head Wo Contrast  Result Date: 09/17/2016 CLINICAL DATA:  71 y/o F; status post fall with altered mental status change. History of cancer. EXAM: CT HEAD WITHOUT CONTRAST CT CERVICAL SPINE WITHOUT CONTRAST TECHNIQUE: Multidetector CT imaging of the head and cervical spine was performed following the standard protocol without intravenous contrast. Multiplanar CT image reconstructions of the cervical spine were also generated. COMPARISON:  09/02/2016 CT of the abdomen MRI of the head. FINDINGS: CT HEAD FINDINGS Brain: Acute subarachnoid hemorrhage is present centered within the right MCA and suprasellar cistern with extension into the perimesencephalic cisterns, right sylvian fissure, and scattered over the right greater than left frontal convexities. The small focus of hemorrhage is present within the left quadrigeminal plate cistern. There is no evidence for large acute territory infarct of the brain. 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 There is a background of moderate chronic microvascular ischemic changes and parenchymal volume loss. In comparison with the prior MRI of the brain ventricle size is stable. Additionally there is a thin subdural hematoma along the falx. Vascular: Calcific atherosclerosis of the cavernous internal carotid arteries. Skull: There is a scalp contusion in the left parietal region with  hematoma. There is a stable lucent lesion within the frontal bones in the midline (series 3, image 77) with T1 and T2 hyperintense signal on prior MR, probably a hemangioma. 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 Sinuses/Orbits: Orbits are unremarkable.Extensive paranasal sinus disease with fluid levels. Left mastoid effusion. Other: None. CT CERVICAL SPINE FINDINGS Alignment: There is a fracture through the C6-7 intervertebral disc with diastases of the C6 and C7 vertebral bodies up to 12 mm. While there is diastases of the C6 and C7 vertebral bodies their relative (AP and ML) position is essentially normally aligned. There is a fracture through the spinous process of the C6 vertebral body as well as an additional fracture through the inferior articular facet of the right-sided C6 vertebral body. There is slight separation of the right-sided C6-7 facet and there is diastatic partial anterior subluxation of the left C6-7 facet joint without frank dislocation. Skull base and vertebrae: Diffuse ankylosis and fusion throughout the visualized cervical and thoracic spine. Soft tissues and spinal canal: There is edema within paraspinal muscles and prevertebral soft tissues at the level of the C6-7 disc fracture. Disc levels: Bony fusion of the anterior and posterior longitudinal ligaments results and multiple levels of canal stenosis and foraminal narrowing. Upper chest: Negative. Other: None. IMPRESSION: CT of the head: 1. Acute subarachnoid hemorrhage centered within the right MCA and the suprasellar cistern with extension over the right greater than left frontal convexities. The central location favors a ruptured aneurysm or vascular malformation. 2. Trace subdural hemorrhage along the falx. 3. Left left parietal scalp contusion and hematoma. No displaced calvarial fracture. CT of the cervical spine 1. Diastatic fracture through the C6-7 intervertebral disc with 12 mm of separation of the C6 and C7 vertebral  bodies. This is an unstable fracture. 2. Additional fractures of the C6 spinous process and the inferior articular facet of C6. 3. Slight separation of the right-sided C6-7 facet and partial subluxation and diastases of the left C6-7 facet joint without frank dislocation. Critical Value/emergent results were called by telephone at the time of interpretation on 09/17/2016 at 4:59 am to Dr. Joseph Berkshire , who verbally acknowledged  these results. Electronically Signed   By: Kristine Garbe M.D.   On: 09/20/2016 05:03   Ct Cervical Spine Wo Contrast  Result Date: 09/06/2016 CLINICAL DATA:  71 y/o F; status post fall with altered mental status change. History of cancer. EXAM: CT HEAD WITHOUT CONTRAST CT CERVICAL SPINE WITHOUT CONTRAST TECHNIQUE: Multidetector CT imaging of the head and cervical spine was performed following the standard protocol without intravenous contrast. Multiplanar CT image reconstructions of the cervical spine were also generated. COMPARISON:  09/02/2016 CT of the abdomen MRI of the head. FINDINGS: CT HEAD FINDINGS Brain: Acute subarachnoid hemorrhage is present centered within the right MCA and suprasellar cistern with extension into the perimesencephalic cisterns, right sylvian fissure, and scattered over the right greater than left frontal convexities. The small focus of hemorrhage is present within the left quadrigeminal plate cistern. There is no evidence for large acute territory infarct of the brain. 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 There is a background of moderate chronic microvascular ischemic changes and parenchymal volume loss. In comparison with the prior MRI of the brain ventricle size is stable. Additionally there is a thin subdural hematoma along the falx. Vascular: Calcific atherosclerosis of the cavernous internal carotid arteries. Skull: There is a scalp contusion in the left parietal region with hematoma. There  is a stable lucent lesion within the frontal bones in the midline (series 3, image 77) with T1 and T2 hyperintense signal on prior MR, probably a hemangioma. 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 Sinuses/Orbits: Orbits are unremarkable.Extensive paranasal sinus disease with fluid levels. Left mastoid effusion. Other: None. CT CERVICAL SPINE FINDINGS Alignment: There is a fracture through the C6-7 intervertebral disc with diastases of the C6 and C7 vertebral bodies up to 12 mm. While there is diastases of the C6 and C7 vertebral bodies their relative (AP and ML) position is essentially normally aligned. There is a fracture through the spinous process of the C6 vertebral body as well as an additional fracture through the inferior articular facet of the right-sided C6 vertebral body. There is slight separation of the right-sided C6-7 facet and there is diastatic partial anterior subluxation of the left C6-7 facet joint without frank dislocation. Skull base and vertebrae: Diffuse ankylosis and fusion throughout the visualized cervical and thoracic spine. Soft tissues and spinal canal: There is edema within paraspinal muscles and prevertebral soft tissues at the level of the C6-7 disc fracture. Disc levels: Bony fusion of the anterior and posterior longitudinal ligaments results and multiple levels of canal stenosis and foraminal narrowing. Upper chest: Negative. Other: None. IMPRESSION: CT of the head: 1. Acute subarachnoid hemorrhage centered within the right MCA and the suprasellar cistern with extension over the right greater than left frontal convexities. The central location favors a ruptured aneurysm or vascular malformation. 2. Trace subdural hemorrhage along the falx. 3. Left left parietal scalp contusion and hematoma. No displaced calvarial fracture. CT of the cervical spine 1. Diastatic fracture through the C6-7 intervertebral disc with 12 mm of separation of the C6 and C7 vertebral bodies. This is an  unstable fracture. 2. Additional fractures of the C6 spinous process and the inferior articular facet of C6. 3. Slight separation of the right-sided C6-7 facet and partial subluxation and diastases of the left C6-7 facet joint without frank dislocation. Critical Value/emergent results were called by telephone at the time of interpretation on 10/04/2016 at 4:59 am to Dr. Joseph Berkshire , who verbally acknowledged these results. Electronically Signed  By: Kristine Garbe M.D.   On: 10/05/2016 05:03   Dg Chest Portable 1 View  Result Date: 09/16/2016 CLINICAL DATA:  Respiratory failure EXAM: PORTABLE CHEST 1 VIEW COMPARISON:  09/29/2016 at 02:08 FINDINGS: Endotracheal tube is 2.3 cm above the carina. Nasogastric tube extends into the stomach and beyond the inferior edge of the image. Basilar airspace and perihilar opacity on the left. No pneumothorax. IMPRESSION: Support equipment appears satisfactorily positioned. Central and basilar left-sided airspace opacities. Electronically Signed   By: Andreas Newport M.D.   On: 09/26/2016 05:49   Dg Chest Port 1 View  Result Date: 10/04/2016 CLINICAL DATA:  [place] today.  Altered mental status. EXAM: PORTABLE CHEST 1 VIEW COMPARISON:  05/15/2015 FINDINGS: Stable mild cardiomegaly. No airspace consolidation. No large effusion. Normal pulmonary vasculature. IMPRESSION: No acute findings.  Stable mild cardiomegaly. Electronically Signed   By: Andreas Newport M.D.   On: 09/25/2016 02:24    Procedures Procedures (including critical care time)  Medications Ordered in ED Medications  flumazenil (ROMAZICON) injection 0.2 mg (0.2 mg Intravenous Not Given 09/18/2016 0445)  fentaNYL 2523mcg in NS 268mL (1mcg/ml) infusion-PREMIX (10 mcg/hr Intravenous New Bag/Given 09/16/2016 0516)  phenylephrine (NEO-SYNEPHRINE) 10 mg in dextrose 5 % 250 mL (0.04 mg/mL) infusion (25 mcg/min Intravenous New Bag/Given 09/10/2016 0633)  vancomycin (VANCOCIN) 1,250 mg in sodium chloride  0.9 % 250 mL IVPB (not administered)  piperacillin-tazobactam (ZOSYN) IVPB 2.25 g (not administered)  LORazepam (ATIVAN) injection 0.5 mg (0.5 mg Intravenous Given 09/22/2016 0351)  sodium chloride 0.9 % bolus 1,000 mL (0 mLs Intravenous Stopped 10/01/2016 0545)  etomidate (AMIDATE) injection 20 mg (20 mg Intravenous Given 10/05/2016 0459)  rocuronium (ZEMURON) injection 40 mg (40 mg Intravenous Given 09/11/16 0459)  piperacillin-tazobactam (ZOSYN) IVPB 3.375 g (3.375 g Intravenous New Bag/Given 09/11/16 0640)     Initial Impression / Assessment and Plan / ED Course  I have reviewed the triage vital signs and the nursing notes.  Pertinent labs & imaging results that were available during my care of the patient were reviewed by me and considered in my medical decision making (see chart for details).  Clinical Course    Patient brought to the emergency department for evaluation of altered mental status. Patient has been seen in the hospital recently for similar complaints. Patient has end-stage renal disease but has declined dialysis. Husband reports that she did have a fall recently in the last couple of days. He describes falling forward onto her hands and knees, no head injury. She has, however, had multiple falls recently.  Patient was agitated but nonfocal on arrival. There was difficulty in obtaining CT scan because of the agitation. Patient given Ativan 0.5 mg IV at which time she was able to have CT performed. CT did show evidence of subarachnoid hemorrhage, possibly secondary to aneurysm. CT cervical spine, however, does show unstable fracture.  Patient became hypotensive and less responsive after CT. It was unclear if this was secondary to small dose of Ativan or her head bleed. Decision was made to intubate. Nose felt that reversing the Ativan would not be helpful, as she was already very agitated, also did not want to facilitate seizure. Intubation was performed without difficulty.  Case discussed  with Dr. Cyndy Freeze. He recommends MRA to differentiate trauma versus aneurysm. Dr. Cyndy Freeze feels that if this is aneurysmal bleed, there would likely be no neurosurgical intervention possible. Patient cannot have coiling procedure performed because she cannot get IV dye. Craniotomy would not be possible because of the  cervical spine fracture. At that point patient would likely become palliative care/comfort measures.  Patient has become more hypotensive. Repeat x-ray after intubation also now shows opacity, possible infiltrate in the left lung. Cannot rule out pneumonia. With hypotension, sepsis is also possible. Discussed with Dr. Corrie Dandy, critical care. Recommends small fluid bolus, but patient is not to receive 4 30 mL/kg because of renal impairment. Recommended broad-spectrum antibiotics and Neo-Synephrine for blood pressure support.  Will sign out to on-coming Physician to follow up MRA, d/w Dr. Cyndy Freeze and critical care.  CRITICAL CARE Performed by: Orpah Greek   Total critical care time: 45 minutes  Critical care time was exclusive of separately billable procedures and treating other patients.  Critical care was necessary to treat or prevent imminent or life-threatening deterioration.  Critical care was time spent personally by me on the following activities: development of treatment plan with patient and/or surrogate as well as nursing, discussions with consultants, evaluation of patient's response to treatment, examination of patient, obtaining history from patient or surrogate, ordering and performing treatments and interventions, ordering and review of laboratory studies, ordering and review of radiographic studies, pulse oximetry and re-evaluation of patient's condition.   Final Clinical Impressions(s) / ED Diagnoses   Final diagnoses:  SAH (subarachnoid hemorrhage) ([place])   I personally performed the services described in this documentation, which was scribed in my presence.  The recorded information has been reviewed and is accurate.  New Prescriptions New Prescriptions   No medications on file     Orpah Greek, MD 09/26/2016 [phone]

## 2016-09-11 NOTE — ED Notes (Signed)
The pts husbvand has arrived

## 2016-09-11 NOTE — ED Notes (Signed)
Returned to ED from MRI. Husband in the room.

## 2016-09-11 NOTE — ED Notes (Signed)
Pt in xray

## 2016-09-11 NOTE — ED Notes (Signed)
New bag of Neo hung. Pt moving arms. Unable to assess if she is following commands. Fentanyl gtt restarted at 51mcq/hr

## 2016-09-11 NOTE — ED Notes (Signed)
14Fr foley placed. Sterile. With 2 staff memebers.

## 2016-09-11 NOTE — ED Notes (Signed)
P;t brought back from the c-t scanner unable to be scanned she is more agitated now than previously

## 2016-09-11 NOTE — Progress Notes (Signed)
Pharmacy Antibiotic Note  Tammy Hutchinson is a 71 y.o. female admitted on 09/05/2016 with AMS/VDRF and possible aspiration.  H/O ESRD not on hemodialysis  Pharmacy has been consulted for Vancomycin and Zosyn dosing.  Plan: Vancomycin 1250 mg IV now. Redose after checking level in 3-4 days Zosyn 3.375 g IV now, then 2.25 g IV q8h    No data recorded.   Recent Labs Lab 09/09/2016 0202  WBC 9.1  CREATININE 9.18*    CrCl cannot be calculated (Unknown ideal weight.).    No Known Allergies   Caryl Pina 09/26/2016 6:32 AM

## 2016-09-11 NOTE — ED Provider Notes (Signed)
Procedure Name: Intubation Date/Time: 09/24/2016 5:20 AM Performed by: Abigail Butts Pre-anesthesia Checklist: Patient identified, Suction available, Emergency Drugs available, Patient being monitored and Timeout performed Oxygen Delivery Method: Non-rebreather mask Preoxygenation: Pre-oxygenation with 100% oxygen Intubation Type: Rapid sequence Ventilation: Mask ventilation without difficulty Laryngoscope Size: Glidescope Tube size: 7.5 mm Number of attempts: 1 Airway Equipment and Method: Video-laryngoscopy and Rigid stylet Placement Confirmation: ETT inserted through vocal cords under direct vision,  CO2 detector and Breath sounds checked- equal and bilateral Secured at: 22 cm Tube secured with: ETT holder Dental Injury: Teeth and Oropharynx as per pre-operative assessment           Abigail Butts, PA-C 09/22/2016 [place], MD 09/25/2016 [phone]

## 2016-09-11 NOTE — Consult Note (Signed)
CC:  Chief Complaint  Patient presents with  . Altered Mental Status    HPI: Tammy Hutchinson is a 71 y.o. female with multiple medical problems who presented with altered mental status.  She presented to the ER one week ago with altered mental status and difficulty ambulating.  This was thought to be due to uremia.  Last night she became disoriented and fell but reportedly did not strike her head.  She became progressively more confused and was brought to the ER by EMS.  She was intubated due to mental status and agitation.  PMH: Past Medical History:  Diagnosis Date  . Cancer (HCC)    hx of colon cancer  . Chronic kidney disease   . Depression   . Hx of transfusion of packed red blood cells   . Hypertension    sees Dr. Corliss Parish [phone]    PSH: Past Surgical History:  Procedure Laterality Date  . APPENDECTOMY    . AV FISTULA PLACEMENT    . BVT  04/03/12   BVT  Left Bascilic Vein Transposition  . CHOLECYSTECTOMY    . COLON SURGERY     approx. 10 years ago    [place]: Social History  Substance Use Topics  . Smoking status: Former Smoker    Packs/day: 1.00    Years: 5.00    Types: Cigarettes    Quit date: 12/15/2002  . Smokeless tobacco: Never Used  . Alcohol use No    MEDS: Prior to Admission medications   Medication Sig Start Date End Date Taking? Authorizing Provider  benztropine (COGENTIN) 1 MG tablet Take 1 mg by mouth at bedtime.      Historical Provider, MD  BYSTOLIC 5 MG tablet Take 5 mg by mouth every evening. 07/09/16   Historical Provider, MD  diphenoxylate-atropine (LOMOTIL) 2.5-0.025 MG tablet Take 1 tablet by mouth 4 (four) times daily as needed for diarrhea or loose stools.  07/14/16   Historical Provider, MD  loxapine (LOXITANE) 10 MG capsule Take 10 mg by mouth at bedtime.      Historical Provider, MD    ALLERGY: No Known Allergies  ROS: Review of Systems  Unable to perform ROS: Intubated    NEUROLOGIC EXAM: Intubated,  sedated PERRL Moves all extremities in response to painful stimulus  IMAGING: CT Head: Suprasellar cistern, right sylvian fissure, and convexity subarachnoid hemorrhage CT CSP: DISH.  Fracture through the disc space of C6-7 with distraction and facet disruption. MRA: No apparent intracranial vascular malformation.  No cervical vascular injury. MRI CSP: Similar injury seen.  Some cord edema.   IMPRESSION/PLAN: - 71 y.o. female with multiple medical problems, severe traumatic brain injury, unstable cervical spine injury, and pneumonia.  Her pattern of subarachnoid hemorrhage could very well be traumatic, but because of the amount and distribution of blood I think she needs an arteriogram.  I foresee a poor prognosis for this patient.  Her cervical spine injury is markedly unstable, but I don't think it is safe to intervene until we have ruled out an intracranial aneurysm.  She will have an arteriogram tomorrow.  She will stay in an [place]en collar until then.  Palliative care has been consulted, perhaps they will have better luck than I did in conveying the seriousness of her condition, especially in the face of her multiple comorbidities.

## 2016-09-11 NOTE — ED Notes (Signed)
Called CT scan

## 2016-09-11 NOTE — ED Notes (Signed)
Warming blanket applied. Explained procedure to husband and updated on poc as best I could. Husband appears to have a difficult time understanding pts level of illness as he responded by 'so yall will keep her here a couple of days, right?' attempted to redirect and update him on the poc. Will continue conversation with him

## 2016-09-11 NOTE — Consult Note (Signed)
Consultation Note Date: 09/29/2016   Patient Name: Tammy Hutchinson  DOB: March 27, 1946  MRN: [id number]  Age / Sex: 71 y.o., female  PCP: Ann Held, DO Referring Physician: Sharlett Iles, MD  Reason for Consultation: Establishing goals of care  HPI/Patient Profile: 71 y.o. female  with past medical history of HTN, ESRD admitted on 10/05/2016 with fall, weakness .   Clinical Assessment and Goals of Care:  71 yo lady who lives at home with her husband in [place], they have no kids. Patient has ESRD, not yet on dialysis, ongoing generalized weakness, falls at home, several ED visits recently, has refused dialysis in the past, has fistula in LUE.   Husband is the primary historian, he states that the patient got up in the middle of the night to go to the bathroom, then fell on her way back to her bed. She was confused, she was hence, brought into the ED. Head CT with acute SDH, IVH, cervical Fx, CXR with LL infiltrate. She was intubated in the ED, she has been getting IVF boluses for hypotension. She has been seen by neuro surgery in the ED.   A palliative consultation has been requested as the patient is acutely ill and has serious acute and underlying co morbidities.   The patient is intubated and mechanically ventilated, she is on Retail banker. She does not open eyes to voice commands, she does not withdraw to pain, she is not on sedation currently, fentanyl drip was d/c recently due to ow BPs.   Husband was in the waiting area and arrived shortly there after. I introduced myself and palliative care as follows: Palliative medicine is specialized medical care for people living with serious illness. It focuses on providing relief from the symptoms and stress of a serious illness. The goal is to improve quality of life for both the patient and the family.  Brief life review performed, the patient's  husband talks about her ongoing decline for the past few months, she hated being stuck for peripheral IV placement, she hated the fistula being placed, husband states, "they treated her hand like a pin cushion and she hated it."   I have discussed explicitly with Mr Chmielewski about the patient's current state, she is critically ill, with a high risk of dying, regardless of what interventions are done. She has cervical fractures, epidural hematoma, ongoing renal failure.   We discussed about what the patient would want, should she be able to look at herself on the stretcher in the ED, on the breathing machine. Husband becomes tearful and states that he would like to continue with the machines. He starts walking away from me, leaving the room. I asked him to come back in, requested him to sit down, he did not wish to do so. Patient's husband is distraught, he is able to acknowledge that the patient could die inspite of all aggressive measures, we also discussed that she might not want these aggressive measures, how ever, patient's husband, at present is not  in a frame of mind to continue further conversations. He states that he is eagerly waiting for his parents to arrive at the hospital so that they can be a source of support for him.   Thank you for the consult, we will continue to follow along.   NEXT OF KIN  husband Mr Shambaugh [id number], they have no children. Patient's son committed suicide several years ago, pet rabbit "snowflake" at home, they live in [place]    full code for now Will need to continue conversations with husband regarding the serious nature of the patient's illnesses: brain bleed, VDRF, ongoing worsening kidney disease.  Husband understands that the patient has a high risk for dying in spite of aggressive care and being on machines.  Recommend spiritual care consult.   Code Status/Advance Care Planning:  Full code    Symptom Management:     as above   Palliative Prophylaxis:   Bowel Regimen  Additional Recommendations (Limitations, Scope, Preferences):  Full Scope Treatment  Psycho-social/Spiritual:   Desire for further Chaplaincy support:yes  Additional Recommendations: Education on Hospice  Prognosis:   Hours - Days  Discharge Planning: Anticipated Hospital Death      Primary Diagnoses: Present on Admission: **None**   I have reviewed the medical record, interviewed the patient and family, and examined the patient. The following aspects are pertinent.  Past Medical History:  Diagnosis Date  . Cancer (HCC)    hx of colon cancer  . Chronic kidney disease   . Depression   . Hx of transfusion of packed red blood cells   . Hypertension    sees Dr. Corliss Parish [phone]   Social History   Social History  . Marital status: Married    Spouse name: N/A  . Number of children: N/A  . Years of education: N/A   Social History Main Topics  . Smoking status: Former Smoker    Packs/day: 1.00    Years: 5.00    Types: Cigarettes    Quit date: 12/15/2002  . Smokeless tobacco: Never Used  . Alcohol use No  . Drug use: No  . Sexual activity: Not Asked   Other Topics Concern  . None   Social History Narrative  . None   Family History  Problem Relation Age of Onset  . Stroke Mother   . Heart disease Father   . Cancer Father     COLON   Scheduled Meds: . flumazenil  0.2 mg Intravenous Once   Continuous Infusions: . fentaNYL infusion INTRAVENOUS 10 mcg/hr (09/21/2016 0516)  . phenylephrine (NEO-SYNEPHRINE) Adult infusion 25 mcg/min (09/15/2016 [id number])  . piperacillin-tazobactam (ZOSYN)  IV    . vancomycin 1,250 mg (09/10/2016 0905)   PRN Meds:. Medications Prior to Admission:  Prior to Admission medications   Medication Sig Start Date End Date Taking? Authorizing Provider  benztropine (COGENTIN) 1 MG tablet Take 1 mg by mouth at bedtime.      Historical Provider, MD  BYSTOLIC 5 MG tablet  Take 5 mg by mouth every evening. 07/09/16   Historical Provider, MD  diphenoxylate-atropine (LOMOTIL) 2.5-0.025 MG tablet Take 1 tablet by mouth 4 (four) times daily as needed for diarrhea or loose stools.  07/14/16   Historical Provider, MD  loxapine (LOXITANE) 10 MG capsule Take 10 mg by mouth at bedtime.      Historical Provider, MD   No Known Allergies Review of Systems Essentially unresponsive on vent.   Physical Exam  Age appropriate appearing female  On Vent Not on sedation On bair hugger Clear breath sounds anterior lung fields Does not withdraw to pain No edema  Vital Signs: BP (!) 119/45   Pulse (!) 52   Temp (!) 93.3 F (34.1 C) (Axillary)   Resp 18   Ht 5\' 4"  (1.626 m)   SpO2 100%  Pain Assessment: CPOT   Pain Score: 0-No pain   SpO2: SpO2: 100 % O2 Device:SpO2: 100 % O2 Flow Rate: .   IO: Intake/output summary:  Intake/Output Summary (Last 24 hours) at 09/14/2016 1032 Last data filed at 09/26/2016 0850  Gross per 24 hour  Intake             1050 ml  Output              812 ml  Net              238 ml    LBM:   Baseline Weight:   Most recent weight:       Palliative Assessment/Data:   Flowsheet Rows   Flowsheet Row Most Recent Value  Intake Tab  Referral Department  Critical care  Unit at Time of Referral  ER  Palliative Care Primary Diagnosis  Other (Comment) [fall, renal failure, cervical fx, brain bleed. ]  Palliative Care Type  New Palliative care  Reason for referral  Clarify Goals of Care  Date first seen by Palliative Care  09/10/2016  Clinical Assessment  Palliative Performance Scale Score  10%  Pain Max last 24 hours  3  Pain Min Last 24 hours  2  Dyspnea Max Last 24 Hours  3  Dyspnea Min Last 24 hours  2  Nausea Max Last 24 Hours  3  Nausea Min Last 24 Hours  2  Anxiety Max Last 24 Hours  3  Anxiety Min Last 24 Hours  2  Psychosocial & Spiritual Assessment  Palliative Care Outcomes  Patient/Family meeting held?  Yes  Who was at the  meeting?  with husband.   Palliative Care Outcomes  Clarified goals of care      Time In:  9.30 Time Out:  10.30 Time Total:   60 min  Greater than 50%  of this time was spent counseling and coordinating care related to the above assessment and plan.  Signed by: Loistine Chance, MD  [phone]  Please contact Palliative Medicine Team phone at [phone] for questions and concerns.  For individual provider: See Shea Evans

## 2016-09-11 NOTE — Progress Notes (Signed)
[facility] Progress Note Patient Name: Tammy Hutchinson DOB: Dec 10, 1945 MRN: [id number]   Date of Service  09/09/2016  HPI/Events of Note  BC Pos strep pneumo on vanc/ zosyn  eICU Interventions  Changed to rocephin      Intervention Category Major Interventions: Infection - evaluation and management  Christinia Gully 10/02/2016, 9:57 PM

## 2016-09-11 NOTE — ED Notes (Addendum)
Per Dr Rowe Pavy with palliative care pts husband is not consenting to comfort measures at this time. Pt remains full code

## 2016-09-11 NOTE — ED Notes (Signed)
Pt nonj-verbal  Unknown if new or old  hiusband prob will not be coming according to ems

## 2016-09-11 NOTE — ED Notes (Signed)
Lab at the bedside for ammonia

## 2016-09-11 NOTE — ED Notes (Signed)
Patient transported to CT 

## 2016-09-11 NOTE — ED Notes (Signed)
Pt cannot follow  Instructions she makes eye contact but does not speak

## 2016-09-11 NOTE — ED Notes (Signed)
Critical Care resident in for pt admit and speaking with pts husband

## 2016-09-11 NOTE — ED Notes (Signed)
Hob elevated approx 30*

## 2016-09-11 NOTE — ED Notes (Signed)
Decreased Neo gtt to 74mcq/hr

## 2016-09-11 NOTE — ED Triage Notes (Signed)
Pt arrived by [facility] from home  They were called by the pts husband.  She has been confused more than usual  She was just here one week ago for the same.  The husband reports that she could not walk when she was here last she could not walk and she still cannot walk

## 2016-09-11 NOTE — ED Notes (Signed)
Portable chest xray being completed.  

## 2016-09-11 NOTE — Progress Notes (Signed)
Patient ID: Tammy Hutchinson, female   DOB: March 27, 1946, 71 y.o.   MRN: [id number]   Extensive d/w husband and mother in law Also spoke to renal, she is NOT a candidate for HD Severe bipolar dz, now complicated by this SAH, neck fx, pulm edema,pna  I have had extensive discussions with family. We discussed patients current circumstances and organ failures. We also discussed patient's prior wishes under circumstances such as this. Family has decided to NOT perform resuscitation if arrest but to continue current medical support for now.  I would ask that we HOLD off on further work up for sah. I will re discuss comfort with husband in am   Lavon Paganini. Titus Mould, MD, [place] Pgr: [facility]

## 2016-09-11 NOTE — ED Notes (Signed)
Palliative care MD in for pt eval and discussion with pts husband

## 2016-09-11 NOTE — ED Notes (Signed)
Delay in lab draw pt not in room at this time 

## 2016-09-11 NOTE — Progress Notes (Signed)
  PHARMACY - PHYSICIAN COMMUNICATION CRITICAL VALUE ALERT - BLOOD CULTURE IDENTIFICATION (BCID)  Results for orders placed or performed during the hospital encounter of 09/10/2016  Blood Culture ID Panel (Reflexed) (Collected: 09/29/2016  6:05 AM)  Result Value Ref Range   Enterococcus species NOT DETECTED NOT DETECTED   Listeria monocytogenes NOT DETECTED NOT DETECTED   Staphylococcus species NOT DETECTED NOT DETECTED   Staphylococcus aureus NOT DETECTED NOT DETECTED   Streptococcus species DETECTED (A) NOT DETECTED   Streptococcus agalactiae NOT DETECTED NOT DETECTED   Streptococcus pneumoniae DETECTED (A) NOT DETECTED   Streptococcus pyogenes NOT DETECTED NOT DETECTED   Acinetobacter baumannii NOT DETECTED NOT DETECTED   Enterobacteriaceae species NOT DETECTED NOT DETECTED   Enterobacter cloacae complex NOT DETECTED NOT DETECTED   Escherichia coli NOT DETECTED NOT DETECTED   Klebsiella oxytoca NOT DETECTED NOT DETECTED   Klebsiella pneumoniae NOT DETECTED NOT DETECTED   Proteus species NOT DETECTED NOT DETECTED   Serratia marcescens NOT DETECTED NOT DETECTED   Haemophilus influenzae NOT DETECTED NOT DETECTED   Neisseria meningitidis NOT DETECTED NOT DETECTED   Pseudomonas aeruginosa NOT DETECTED NOT DETECTED   Candida albicans NOT DETECTED NOT DETECTED   Candida glabrata NOT DETECTED NOT DETECTED   Candida krusei NOT DETECTED NOT DETECTED   Candida parapsilosis NOT DETECTED NOT DETECTED   Candida tropicalis NOT DETECTED NOT DETECTED   Currently on vancomycin/zosyn for PNA. 2 Sets of BCx drawn on 1/7, 2/2 of 1 set of BCx with GP (BCID + for strep pneumo).  Name of physician (or Provider) Contacted: [facility] - elink  Changes to prescribed antibiotics required: transition to ceftriaxone per Dr. Melvyn Novas. D/c vancomycin/zosyn  Elicia Lamp, PharmD, BCPS Clinical Pharmacist 10/02/2016 9:55 PM

## 2016-09-11 NOTE — ED Notes (Signed)
Fentanyl dripped on hold. No movement from pt. Neo gtt at 41mcq/min. Will tritrate up for bp

## 2016-09-11 NOTE — H&P (Signed)
Name: Tammy Hutchinson MRN: [id number] DOB: [birth year]    LOS: 0  PCCM ADMISSION NOTE  History of Present Illness: Tammy Hutchinson is a 71 yo female with CKD V s/p mature LUE AVF refusing HD, h/o colon cancer, HTN, psychiatric disorder, and h/o non-compliance who presented to the ED on 09/21/2016 after a fall at home. History is provided by chart and by patient's husband. Over the past one month, patient has had increasing confusion, weakness and frequent falls at home. She has resisted coming into the ED per husband as she is afraid of needle sticks. She was seen on 12/29 in the ED for generalized weakness and difficutyt ambulating. CT and MRI at that time showed no acute abnormality. Her confusion and weakness were attributed to uremia as patient continues to refuse dialysis (mainly due to fear of needle sticks) and she was discharged home with PT follow up.   Last night at 2100, patient's husband heard her fall and found her face down on the ground. She did not speak to him after he went to get her, although she was awake. He put her in bed while he called EMS and again, she fell out of bed. He denies that she had any immediate complaints prior to fall including headache, vision changes. She has had aphasia for the past one week in addition to increased weakness, confusion and difficult ambulation.   In the ED, patient was agitated, nonfocal, hypertensive. BMET revealed creatinine of 9.18 and BUN of 79, bicarb 10. CBC with hemoglobin of 7.6. EKG showed NSR. CXR negative. 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 Patient later became less responsive and hypotensive and decision was made to intubate and start on Neosynephrine. Post intubated ABG revealed pH of 7.116, pCo2 of 30.6 and pO2 of 332 on 450/18/100/5. Her FiO2 was then decreased to 50.   Dr. Cyndy Freeze with Neurosurgery recommended MRA to differentiate trauma versus aneurysm. Dr. Cyndy Freeze reported that if this is aneurysmal bleed,  there would likely be no neurosurgical intervention possible. Patient cannot have coiling procedure performed because she cannot get IV dye. Craniotomy would not be possible because of the cervical spine fracture. He also stated that her pattern of subarachnoid hemorrhage could very well be traumatic, but recommended arteriogram tomorrow. Cervical spine injury is markedly unstable, but not safe to intervene until aneurysm is ruled out.  Lines / Drains: ETT 1/7 >> Foley 1/7 >>  Cultures: BCx 1/7 >> UA >> no evidence of infection UCx 1/7 >>  Antibiotics: Vanc 1/7 >> Zosyn 1/7>>  Tests / Events: CXR 1/7 >> No acute findings.  Stable mild cardiomegaly. CT Cervical Spine 1/7 >> Diastatic fracture through the C6-7 intervertebral disc with 12 mm of separation of the C6 and C7 vertebral bodies. This is an unstable fracture. Additional fractures of the C6 spinous process and the inferior articular facet of C6. Slight separation of the right-sided C6-7 facet and partial subluxation and diastases of the left C6-7 facet joint without frank dislocation. CT Head 1/7 >> Acute subarachnoid hemorrhage centered within the right MCA and the suprasellar cistern with extension over the right greater than left frontal convexities. The central location favors a ruptured aneurysm or vascular malformation. Trace subdural hemorrhage along the falx. Left left parietal scalp contusion and hematoma. No displaced calvarial fracture. CXR 1/7 >> Support equipment appears satisfactorily positioned. Central and basilar left-sided airspace opacities. MRA Head 1/7 >> Thick subarachnoid hemorrhage without visualized aneurysm. CTA or catheter angiogram suggested if  clinically appropriate. Nonvisualized nondominant left vertebral artery, see MRA neck reported separately. MRA Neck 1/7 >> Non dominant left vertebral artery is not seen at the V3 and V4 segments, of indeterminate chronicity. No signs of dissection where patent, the left  vertebral artery arises from the arch and enters the transverse foramen after the diastatic C6-7 left facet. No evidence of right vertebral injury. Cervical carotid atherosclerosis without flow limiting ICA Stenosis. MR Cervical Spine 1/7 >> Cervicothoracic spondyloarthropathy with ankylosis. Superimposed 3 column injury across the C6-7 disc with left facet diastasis and partial ligamentum flavum tear. Fractures are better demonstrated on previous CT. Epidural hematoma of throughout the cervical and thoracic canal with diffuse thecal sac effacement. Cord flattening greatest at C6-7, without visible cord contusion. 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  The patient is sedated, intubated and unable to provide history, which was obtained for available medical records.    Past Medical History:  Diagnosis Date  . Cancer (HCC)    hx of colon cancer  . Chronic kidney disease   . Depression   . Hx of transfusion of packed red blood cells   . Hypertension    sees Dr. Corliss Parish [phone]   Past Surgical History:  Procedure Laterality Date  . APPENDECTOMY    . AV FISTULA PLACEMENT    . BVT  04/03/12   BVT  Left Bascilic Vein Transposition  . CHOLECYSTECTOMY    . COLON SURGERY     approx. 10 years ago   Prior to Admission medications   Medication Sig Start Date End Date Taking? Authorizing Provider  benztropine (COGENTIN) 1 MG tablet Take 1 mg by mouth at bedtime.      Historical Provider, MD  BYSTOLIC 5 MG tablet Take 5 mg by mouth every evening. 07/09/16   Historical Provider, MD  diphenoxylate-atropine (LOMOTIL) 2.5-0.025 MG tablet Take 1 tablet by mouth 4 (four) times daily as needed for diarrhea or loose stools.  07/14/16   Historical Provider, MD  loxapine (LOXITANE) 10 MG capsule Take 10 mg by mouth at bedtime.      Historical Provider, MD   Allergies No Known Allergies  Family History Family History  Problem Relation Age of Onset  . Stroke Mother   . Heart disease Father    . Cancer Father     COLON    Social History  reports that she quit smoking about 13 years ago. Her smoking use included Cigarettes. She has a 5.00 pack-year smoking history. She has never used smokeless tobacco. She reports that she does not drink alcohol or use drugs.  Review Of Systems  11 points review of systems is negative with an exception of listed in HPI.  Physical Exam: Vitals:   09/23/2016 1000 09/30/2016 1030 10/02/2016 1128 09/10/2016 1141  BP: (!) 119/45 (!) 118/42 (!) 75/41   Pulse: (!) 52 (!) 55 (!) 46 (!) 48  Resp: 18 18 18 26   Temp:   97.2 F (36.2 C)   TempSrc:   Axillary   SpO2: 100% 98%  100%  Height:       General: Vital signs reviewed.  Patient is elderly, chronically ill appearing, intubated and sedated.  Neck: C-collar in place, intubated Cardiovascular: Bradycardic, regular rhythm, no murmurs, gallops, or rubs. Pulmonary/Chest: Inspiratory crackles bilaterally, no wheezes, rales, or rhonchi. Abdominal: Soft, non-tender, non-distended, BS + Extremities: 1+ pitting lower extremity edema bilaterally, pulses symmetric and intact bilaterally.  Neurological: Sedated, not following commands, spontaneously moves LUE. Pupils sluggish.  Psychiatric: Unable  to assess  Ventilator settings: Vent Mode: PRVC FiO2 (%):  [45 %-100 %] 45 % Set Rate:  [18 bmp-26 bmp] 26 bmp Vt Set:  [450 mL] 450 mL PEEP:  [5 cmH20] 5 cmH20 Plateau Pressure:  [15 cmH20-16 cmH20] 16 cmH20  Labs and Imaging:  Reviewed.  Please refer to the Assessment and Plan section for relevant results. BMET    Component Value Date/Time   NA 139 09/10/2016 0202   K 4.4 09/10/2016 0202   CL 117 (H) 09/26/2016 0202   CO2 10 (L) 09/21/2016 0202   GLUCOSE 107 (H) 10/04/2016 0202   BUN 79 (H) 09/16/2016 0202   CREATININE 9.18 (H) 10/03/2016 0202   CALCIUM 9.0 09/22/2016 0202   GFRNONAA 4 (L) 09/30/2016 0202   GFRAA 4 (L) 09/14/2016 0202   CBC    Component Value Date/Time   WBC 9.1 09/25/2016 0202    RBC 2.54 (L) 09/09/2016 0202   HGB 7.6 (L) 09/23/2016 0202   HCT 22.7 (L) 10/03/2016 0202   PLT 148 (L) 09/06/2016 0202   MCV 89.4 09/15/2016 0202   MCH 29.9 09/27/2016 0202   MCHC 33.5 09/06/2016 0202   RDW 18.0 (H) 09/14/2016 0202   LYMPHSABS 0.4 (L) 10/05/2016 0202   MONOABS 0.5 09/10/2016 0202   EOSABS 0.0 09/09/2016 0202   BASOSABS 0.0 09/09/2016 0202   Urinalysis    Component Value Date/Time   COLORURINE YELLOW 09/09/2016 0343   APPEARANCEUR CLEAR 09/17/2016 0343   LABSPEC 1.006 09/16/2016 0343   PHURINE 5.0 09/23/2016 0343   GLUCOSEU NEGATIVE 09/06/2016 0343   HGBUR NEGATIVE 09/08/2016 0343   HGBUR negative 09/02/2008 0823   BILIRUBINUR NEGATIVE 09/23/2016 [place] 09/22/2016 0343   PROTEINUR 100 (A) 09/22/2016 0343   UROBILINOGEN 0.2 05/15/2015 0208   NITRITE NEGATIVE 09/05/2016 0343   LEUKOCYTESUR NEGATIVE 09/19/2016 0343    Drugs of Abuse     Component Value Date/Time   LABOPIA NONE DETECTED 09/14/2016 0343   COCAINSCRNUR NONE DETECTED 09/28/2016 0343   LABBENZ NONE DETECTED 09/17/2016 0343   AMPHETMU NONE DETECTED 09/21/2016 0343   THCU NONE DETECTED 09/29/2016 0343   LABBARB NONE DETECTED 09/16/2016 0343     Assessment and Plan: Ms. Grajeda is a 71 yo female with CKD V s/p mature LUE AVF refusing HD, h/o colon cancer, HTN, psychiatric disorder, and h/o non-compliance who presented to the ED on 09/28/2016 after a fall at home. 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 She likely developed neurogenic shock as she became bradycardic, hypotensive and unresponsive. Poor prognosis and if due to aneurysmal bleed, there would likely be no neurosurgical intervention possible.  PULMONARY A: Acute Respiratory Failure 2/2 Neurogenic Shock CXR 1/7 >> Central and basilar left-sided airspace opacities. ABG: pH 7.11 pCO2 30.6 pO2 332 on 450/100/18/5 > now on 450/50/26/5 P:   Vent Bundle Repeat ABG on RR  26 Repeat ABG tomorrow am Repeat CXR tomorrow am  CARDIOVASCULAR A:  Sinus Bradycardia: Likely due to neurogenic shock Hypotension (h/o hypertension): Likely due to neurogenic shock P:  Switch from Neo-Synephrine to Dopamine at max 8 mcg/kg/min, wean off Neo Telemetry Holding home antihypertensives Lasix gtt Check BNP  RENAL A:   CKD V: Matured AVF in LUE, Refuses HD  P:   BMET Q12H Mag, Phos Q12 H Lasix gtt Strict I/Os  GASTROINTESTINAL A:   NPO H/o colon cancer P:   Protonix IV Avoid TF at this point due to  possible pending procedure tomorrow  HEMATOLOGIC A:   Chronic Normocytic Anemia 2/2 CKD: Hemoglobin 7.6  P:  Monitor CBC No anticoagulation due to [facility] SCDs  INFECTIOUS A:   No evidence of infection at this time- afebrile, no leukocytosis UA without evidence of infection Initial CXR without acute process Repeat CXR with central and basilar left-sided airspace opacities P:   Empirically started on Vanc/Zosyn- follow clinically and d/c if appropriate Monitor CBC UCx 1/7 >> pending BCx 1/7 >> pending  ENDOCRINE A:   No active issues P:   Monitor CBG Q4H  NEUROLOGIC A:   Possible Neurogenic Shock Acute Subarachnoid Hemorrhage without visualized aneurysm Trace Subdural Hematoma C6-7 disc Injury with partial ligamentum flavum tear Epidural hematoma of throughout the cervical and thoracic canal with diffuse thecal sac effacement P:   NPO Dopamine gtt as above Repeat CT Head tomorrow am  C-Collar, Bed Rest Neurosurgery following, possible arteriogram tomorrow am Poor prognosis, family is having a difficult time grasping prognosis, continues to desire aggressive care  FAMILY  - Updates: Talked extensively with husband who continues to want aggressive care - Inter-disciplinary family meet or Palliative Care meeting due by: 09/16/16  The patient is critically ill with multiple organ systems failure and requires high complexity decision  making for assessment and support, frequent evaluation and titration of therapies, application of advanced monitoring technologies and extensive interpretation of multiple databases. Critical Care Time devoted to patient care services described in this note is 50 minutes.  Martyn Malay, DO PGY-3 Internal Medicine Resident Pager # [phone] 09/29/2016 12:17 PM   STAFF NOTE: Linwood Dibbles, MD FACP have personally reviewed patient's available data, including medical history, events of note, physical examination and test results as part of my evaluation. I have discussed with resident/NP and other care providers such as pharmacist, RN and RRT. In addition, I personally evaluated patient and elicited key findings of: vented, not awake, crackles lungs, ct reviewed and MRI, she has been refusing HD and now with acidosis, shock ( neurogenic?), she does not appear to have sepsis, NS evaluation reviewed, her prognosis is poor for survival, plan: continued palliative care discussion, heroics here would be futile, CT head follow up, collar neck per NS, she is brady, wean neo off, and add dopamine to bata affect, max 8-10 mics, if still requites support will add levophed, neo has reflex brady side effect, start lasix infusion with gross overload, crt follow up, would avoid contrast as able with crt until we finalize renal plan, she has refused in past, limit sedation, ppi, scd, husband had left and not present The patient is critically ill with multiple organ systems failure and requires high complexity decision making for assessment and support, frequent evaluation and titration of therapies, application of advanced monitoring technologies and extensive interpretation of multiple databases.   Critical Care Time devoted to patient care services described in this note is 30 Minutes. This time reflects time of care of this signee: Merrie Roof, MD FACP. This critical care time does not reflect procedure time, or  teaching time or supervisory time of PA/NP/Med student/Med Resident etc but could involve care discussion time. Rest per NP/medical resident whose note is outlined above and that I agree with   Lavon Paganini. Titus Mould, MD, [place] Pgr: [place] 09/19/2016 12:46 PM

## 2016-09-11 NOTE — ED Notes (Signed)
Dr Cyndy Freeze in speaking with pt's husband and updating on poc.

## 2016-09-12 ENCOUNTER — Inpatient Hospital Stay (HOSPITAL_COMMUNITY): Payer: Medicare Other

## 2016-09-12 DIAGNOSIS — S066X4S Traumatic subarachnoid hemorrhage with loss of consciousness of 6 hours to 24 hours, sequela: Secondary | ICD-10-CM

## 2016-09-12 LAB — BLOOD GAS, ARTERIAL
ACID-BASE DEFICIT: 18.4 mmol/L — AB (ref 0.0–2.0)
BICARBONATE: 8.1 mmol/L — AB (ref 20.0–28.0)
Drawn by: 44135
FIO2: 0.4
LHR: 35 {breaths}/min
O2 Saturation: 98.3 %
PEEP/CPAP: 5 cmH2O
PO2 ART: 136 mmHg — AB (ref 83.0–108.0)
Patient temperature: 95.9
VT: 450 mL
pCO2 arterial: 19.7 mmHg — CL (ref 32.0–48.0)
pH, Arterial: 7.228 — ABNORMAL LOW (ref 7.350–7.450)

## 2016-09-12 LAB — MAGNESIUM: MAGNESIUM: 1.5 mg/dL — AB (ref 1.7–2.4)

## 2016-09-12 LAB — CBC
HEMATOCRIT: 25.2 % — AB (ref 36.0–46.0)
Hemoglobin: 8.4 g/dL — ABNORMAL LOW (ref 12.0–15.0)
MCH: 29.2 pg (ref 26.0–34.0)
MCHC: 33.3 g/dL (ref 30.0–36.0)
MCV: 87.5 fL (ref 78.0–100.0)
Platelets: 182 10*3/uL (ref 150–400)
RBC: 2.88 MIL/uL — ABNORMAL LOW (ref 3.87–5.11)
RDW: 18.2 % — AB (ref 11.5–15.5)
WBC: 14.6 10*3/uL — ABNORMAL HIGH (ref 4.0–10.5)

## 2016-09-12 LAB — URINE CULTURE: Culture: NO GROWTH

## 2016-09-12 LAB — PHOSPHORUS: PHOSPHORUS: 6.8 mg/dL — AB (ref 2.5–4.6)

## 2016-09-12 LAB — GLUCOSE, CAPILLARY
Glucose-Capillary: 72 mg/dL (ref 65–99)
Glucose-Capillary: 80 mg/dL (ref 65–99)
Glucose-Capillary: 81 mg/dL (ref 65–99)
Glucose-Capillary: 83 mg/dL (ref 65–99)

## 2016-09-12 LAB — BASIC METABOLIC PANEL
Anion gap: 14 (ref 5–15)
BUN: 81 mg/dL — ABNORMAL HIGH (ref 6–20)
CALCIUM: 9 mg/dL (ref 8.9–10.3)
CO2: 7 mmol/L — ABNORMAL LOW (ref 22–32)
Chloride: 117 mmol/L — ABNORMAL HIGH (ref 101–111)
Creatinine, Ser: 9.21 mg/dL — ABNORMAL HIGH (ref 0.44–1.00)
GFR calc Af Amer: 4 mL/min — ABNORMAL LOW (ref >60)
GFR, EST NON AFRICAN AMERICAN: 4 mL/min — AB (ref >60)
GLUCOSE: 78 mg/dL (ref 65–99)
POTASSIUM: 4.9 mmol/L (ref 3.5–5.1)
SODIUM: 138 mmol/L (ref 135–145)

## 2016-09-12 MED ORDER — MORPHINE SULFATE 50 MG/ML IV SOLN
10.0000 mg/h | INTRAVENOUS | Status: DC
Start: 2016-09-12 — End: 2016-09-12
  Administered 2016-09-12: 10 mg/h via INTRAVENOUS
  Filled 2016-09-12: qty 5

## 2016-09-12 MED ORDER — LORAZEPAM 2 MG/ML IJ SOLN
5.0000 mg/h | INTRAVENOUS | Status: DC
  Administered 2016-09-12: 5 mg/h via INTRAVENOUS
  Filled 2016-09-12 (×2): qty 25

## 2016-09-12 MED ORDER — SODIUM CHLORIDE 0.9 % IV SOLN: 250.0000 mL | INTRAVENOUS | Status: DC | PRN

## 2016-09-12 MED ORDER — LORAZEPAM BOLUS VIA INFUSION
2.0000 mg | INTRAVENOUS | Status: DC | PRN
  Filled 2016-09-12: qty 5

## 2016-09-12 MED ORDER — MORPHINE BOLUS VIA INFUSION
5.0000 mg | INTRAVENOUS | Status: DC | PRN
  Filled 2016-09-12: qty 20

## 2016-09-13 LAB — CULTURE, BLOOD (ROUTINE X 2)

## 2016-09-17 LAB — CULTURE, BLOOD (ROUTINE X 2): Culture: NO GROWTH

## 2016-10-06 NOTE — Progress Notes (Signed)
Visited w/ pt's husband, his brother, and friend in rm. Provided ministry of presence, emotional/spiritual support and prayer as they reminisced about their lives together w/ pt. Pt and husband have been married since the early '90' (by a justice of the peace, the husband said, as soon as he convinced her to marry). The brother and his wife have the extended family over on Sunday nights, cooking whatever's on sale, and pt is always happy to be w/ the several  grandchildren. The brothers wanted to bring their parents over from [facility] to be w/ pt now, but they are 12 and 91 and, w/ the travel conditions, decided that would not be prudent.   They plan to w/draw care soon, and pt did not have a regular church or pastor. When asked if they would like a brief prayer, the brother deferred to the husband, who assented. We joined hands around pt and prayed for her and for comfort for family, which was appreciated. They know that chaplain is available for f/u upon their request..    10-11-16 1400  Clinical Encounter Type  Visited With Patient and family together  Visit Type Follow-up;Psychological support;Spiritual support;Social support (end of life)  Referral From Nurse  Spiritual Encounters  Spiritual Needs Emotional  Stress Factors  Patient Stress Factors Other (Comment) (end of life)  Family Stress Factors Loss   Gerrit Heck

## 2016-10-06 NOTE — Progress Notes (Signed)
148ml of morphine 1mg /ml wasted in sink with Licensed conveyancer

## 2016-10-06 NOTE — Discharge Summary (Signed)
71 yo female with CKD V s/p mature LUE AVF refusing HD, h/o colon cancer, HTN, psychiatric disorder, and h/o non-compliance who presented to the ED on 10/05/2016 after a fall at home. History is provided by chart and by patient's husband. Over the past one month, patient has had increasing confusion, weakness and frequent falls at home. She has resisted coming into the ED per husband as she is afraid of needle sticks. She was seen on 12/29 in the ED for generalized weakness and difficutyt ambulating. CT and MRI at that time showed no acute abnormality. Her confusion and weakness were attributed to uremia as patient continues to refuse dialysis (mainly due to fear of needle sticks) and she was discharged home with PT follow up.   Last night at 2100, patient's husband heard her fall and found her face down on the ground. She did not speak to him after he went to get her, although she was awake. He put her in bed while he called EMS and again, she fell out of bed. He denies that she had any immediate complaints prior to fall including headache, vision changes. She has had aphasia for the past one week in addition to increased weakness, confusion and difficult ambulation.   In the ED, patient was agitated, nonfocal, hypertensive. BMET revealed creatinine of 9.18 and BUN of 79, bicarb 10. CBC with hemoglobin of 7.6. EKG showed NSR. CXR negative. 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 Patient later became less responsive and hypotensive and decision was made to intubate and start on Neosynephrine. Post intubated ABG revealed pH of 7.116, pCo2 of 30.6 and pO2 of 332 on 450/18/100/5. Her FiO2 was then decreased to 50.   Dr. Cyndy Freeze with Neurosurgery recommended MRA to differentiate trauma versus aneurysm. Dr. Cyndy Freeze reported that if this is aneurysmal bleed, there would likely be no neurosurgical intervention possible. Patient cannot have coiling procedure performed because she cannot  get IV dye. Craniotomy would not be possible because of the cervical spine fracture. He also stated that her pattern of subarachnoid hemorrhage could very well be traumatic, but recommended arteriogram tomorrow. Cervical spine injury is markedly unstable, but not safe to intervene until aneurysm is ruled out.  extesnive d/w husband. D/w renal, not a candidate HD, poor neurostatus and MODS. Active dying. Decided for comfort care  Final dx upon death  1. Traumatic SAH 2. traumtic cervical Fx 3. Acute on chronic renal failure 4. Severe bipolar dz 5. Acute resp failure  Tammy Hutchinson. Tammy Mould, MD, [place] Pgr: [facility]

## 2016-10-06 NOTE — Care Management Note (Signed)
Case Management Note  Patient Details  Name: Tammy Hutchinson MRN: [id number] Date of Birth: [birth year]  Subjective/Objective:  Pt admitted on 09/05/2016 s/p fall with SAH and C6-C7 fx.  PTA, pt resided at home with spouse.                    Action/Plan: Family has chosen to withdraw aggressive measures and offer comfort care.  Currently on Morphine and Ativan drips.  Will follow/offer support as needed.    Expected Discharge Date:                  Expected Discharge Plan:     In-House Referral:  Clinical Social Work, Education officer, community  CM Consult  Post Acute Care Choice:    Choice offered to:     DME Arranged:    DME Agency:     HH Arranged:    [place] Agency:     Status of Service:  Completed, signed off  If discussed at [employer], dates discussed:    Additional Comments:  Reinaldo Raddle, RN, BSN  Trauma/Neuro ICU Case Manager [phone]

## 2016-10-06 NOTE — Progress Notes (Signed)
By report the family is planning to withdraw care this afternoon.  I think this is a reasonable course of action.

## 2016-10-06 NOTE — Progress Notes (Addendum)
Pt with asystole on the monitor, entered room to assess. No breath or cardiac sounds auscultated with Rich RN. Time of death 15:50. Husband and brother in law at bedside. Emotional support given.

## 2016-10-06 NOTE — Progress Notes (Signed)
3ml of fentanyl 43mcg/ml wasted in the sink with Licensed conveyancer

## 2016-10-06 NOTE — Progress Notes (Signed)
Patient transported from 3M07 to CT and back with no complications.

## 2016-10-06 NOTE — Progress Notes (Signed)
Patient exutbated with the withdrawal of life-sustaining protocol. Family at bedside. RN at bedside. Patient resting comfortably on 2 Lpm nasal cannula.

## 2016-10-06 NOTE — Progress Notes (Signed)
Responded to consult for end of life. Pt alone in rm. Consulted w/ nurse as to med/family status. W/drawal of care is planned for 2 pm. Pt's husband and his twin brother have been here and likely went outside to smoke. Will check back later.   09/14/16 1100  Clinical Encounter Type  Visited With Patient not available;Health care provider;Other (Comment) (Family not available)  Visit Type Initial;Psychological support;Spiritual support  Referral From Nurse   Gerrit Heck, Chaplain

## 2016-10-06 NOTE — H&P (Addendum)
Name: Tammy Hutchinson MRN: [id number] DOB: November 13, 1945    LOS: 1  PCCM ADMISSION NOTE  History of Present Illness: Tammy Hutchinson is a 71 yo female with CKD V s/p mature LUE AVF refusing HD, h/o colon cancer, HTN, psychiatric disorder, and h/o non-compliance who presented to the ED on 09/14/2016 after a fall at home. History is provided by chart and by patient's husband. Over the past one month, patient has had increasing confusion, weakness and frequent falls at home. She has resisted coming into the ED per husband as she is afraid of needle sticks. She was seen on 12/29 in the ED for generalized weakness and difficutyt ambulating. CT and MRI at that time showed no acute abnormality. Her confusion and weakness were attributed to uremia as patient continues to refuse dialysis (mainly due to fear of needle sticks) and she was discharged home with PT follow up.   Last night at 2100, patient's husband heard her fall and found her face down on the ground. She did not speak to him after he went to get her, although she was awake. He put her in bed while he called EMS and again, she fell out of bed. He denies that she had any immediate complaints prior to fall including headache, vision changes. She has had aphasia for the past one week in addition to increased weakness, confusion and difficult ambulation.   In the ED, patient was agitated, nonfocal, hypertensive. BMET revealed creatinine of 9.18 and BUN of 79, bicarb 10. CBC with hemoglobin of 7.6. EKG showed NSR. CXR negative. 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 Patient later became less responsive and hypotensive and decision was made to intubate and start on Neosynephrine. Post intubated ABG revealed pH of 7.116, pCo2 of 30.6 and pO2 of 332 on 450/18/100/5. Her FiO2 was then decreased to 50.   Dr. Cyndy Freeze with Neurosurgery recommended MRA to differentiate trauma versus aneurysm. Dr. Cyndy Freeze reported that if this is aneurysmal bleed,  there would likely be no neurosurgical intervention possible. Patient cannot have coiling procedure performed because she cannot get IV dye. Craniotomy would not be possible because of the cervical spine fracture. He also stated that her pattern of subarachnoid hemorrhage could very well be traumatic, but recommended arteriogram tomorrow. Cervical spine injury is markedly unstable, but not safe to intervene until aneurysm is ruled out.  Lines / Drains: ETT 1/7 >> Foley 1/7 >>  Cultures: BCx 1/7 >> UA >> no evidence of infection UCx 1/7 >>  Antibiotics: Vanc 1/7 >> Zosyn 1/7>>  Tests / Events: CXR 1/7 >> No acute findings.  Stable mild cardiomegaly. CT Cervical Spine 1/7 >> Diastatic fracture through the C6-7 intervertebral disc with 12 mm of separation of the C6 and C7 vertebral bodies. This is an unstable fracture. Additional fractures of the C6 spinous process and the inferior articular facet of C6. Slight separation of the right-sided C6-7 facet and partial subluxation and diastases of the left C6-7 facet joint without frank dislocation. CT Head 1/7 >> Acute subarachnoid hemorrhage centered within the right MCA and the suprasellar cistern with extension over the right greater than left frontal convexities. The central location favors a ruptured aneurysm or vascular malformation. Trace subdural hemorrhage along the falx. Left left parietal scalp contusion and hematoma. No displaced calvarial fracture. CXR 1/7 >> Support equipment appears satisfactorily positioned. Central and basilar left-sided airspace opacities. MRA Head 1/7 >> Thick subarachnoid hemorrhage without visualized aneurysm. CTA or catheter angiogram suggested if  clinically appropriate. Nonvisualized nondominant left vertebral artery, see MRA neck reported separately. MRA Neck 1/7 >> Non dominant left vertebral artery is not seen at the V3 and V4 segments, of indeterminate chronicity. No signs of dissection where patent, the left  vertebral artery arises from the arch and enters the transverse foramen after the diastatic C6-7 left facet. No evidence of right vertebral injury. Cervical carotid atherosclerosis without flow limiting ICA Stenosis. MR Cervical Spine 1/7 >> Cervicothoracic spondyloarthropathy with ankylosis. Superimposed 3 column injury across the C6-7 disc with left facet diastasis and partial ligamentum flavum tear. Fractures are better demonstrated on previous CT. Epidural hematoma of throughout the cervical and thoracic canal with diffuse thecal sac effacement. Cord flattening greatest at C6-7, without visible cord contusion. 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  1/8 ct head>>>Stable acute subarachnoid hemorrhage centered within the right MCA and suprasellar cisterns with some extension over the frontal convexities. Interval minimal redistribution with increased hemorrhage in the occipital horns of lateral ventricles. No new acute intracranial hemorrhage.   Subjective : Remains on vent Pos strep in blood Remains on ABX Despite lasix, pos 667  Physical Exam: Vitals:   17-Sep-2016 0615 09-17-16 0630 2016-09-17 0700 Sep 17, 2016 0814  BP: (!) 122/45 (!) 108/52 (!) 117/49 (!) 114/54  Pulse: (!) 50 (!) 50 (!) 51 (!) 52  Resp: (!) 35 (!) 35 (!) 35 (!) 35  Temp: (!) 96.1 F (35.6 C) (!) 96.1 F (35.6 C) (!) 96.4 F (35.8 C)   TempSrc:      SpO2: 100% 100% 100% 100%  Weight:      Height:       General: Vital signs reviewed. Not repsonding Neck: C-collar in place, intubated Cardiovascular: s1 s2 RR R int brad Pulmonary/Chest: coarse BS Abdominal: Soft, non-tender, non-distended, BS + Extremities: 1+ edema.  Neurological: not awake, not following commands, not purposeful upper ext any longer Psychiatric: KNOWN terminal bipolar  Ventilator settings: Vent Mode: PRVC FiO2 (%):  [30 %-100 %] 30 % Set Rate:  [18 bmp-35 bmp] 35 bmp Vt Set:  [450 mL] 450 mL PEEP:  [5 cmH20] 5 cmH20 Plateau Pressure:  [15  cmH20-19 cmH20] 18 cmH20  Labs and Imaging:  Reviewed.  Please refer to the Assessment and Plan section for relevant results. BMET    Component Value Date/Time   NA 138 09/14/2016 2334   K 4.9 09/14/2016 2334   CL 117 (H) 09/26/2016 2334   CO2 7 (L) 09/13/2016 2334   GLUCOSE 78 09/16/2016 2334   BUN 81 (H) 09/17/2016 2334   CREATININE 9.21 (H) 09/13/2016 2334   CALCIUM 9.0 09/26/2016 2334   GFRNONAA 4 (L) 09/20/2016 2334   GFRAA 4 (L) 09/16/2016 2334   CBC    Component Value Date/Time   WBC 14.6 (H) 09-17-2016 0309   RBC 2.88 (L) 2016/09/17 0309   HGB 8.4 (L) 17-Sep-2016 0309   HCT 25.2 (L) 09-17-2016 0309   PLT 182 September 17, 2016 0309   MCV 87.5 Sep 17, 2016 0309   MCH 29.2 09-17-2016 0309   MCHC 33.3 September 17, 2016 0309   RDW 18.2 (H) 2016/09/17 0309   LYMPHSABS 0.4 (L) 09/06/2016 0202   MONOABS 0.5 09/16/2016 0202   EOSABS 0.0 10/03/2016 0202   BASOSABS 0.0 09/21/2016 0202   Urinalysis    Component Value Date/Time   COLORURINE YELLOW 09/21/2016 [place] 09/16/2016 0343   LABSPEC 1.006 09/20/2016 0343   PHURINE 5.0 09/25/2016 [place] 09/06/2016 0343   HGBUR NEGATIVE 09/20/2016 0343  HGBUR negative 09/02/2008 0823   [place] NEGATIVE 09/26/2016 [place] 09/29/2016 0343   PROTEINUR 100 (A) 10/02/2016 0343   UROBILINOGEN 0.2 05/15/2015 0208   NITRITE NEGATIVE 09/08/2016 0343   LEUKOCYTESUR NEGATIVE 10/03/2016 0343    Drugs of Abuse     Component Value Date/Time   LABOPIA NONE DETECTED 09/05/2016 0343   [place] DETECTED 09/14/2016 0343   LABBENZ NONE DETECTED 09/09/2016 0343   AMPHETMU NONE DETECTED 09/26/2016 0343   THCU NONE DETECTED 10/05/2016 0343   LABBARB NONE DETECTED 09/24/2016 0343     Assessment and Plan: Tammy Hutchinson is a 71 yo female with CKD V s/p mature LUE AVF refusing HD, h/o colon cancer, HTN, psychiatric disorder, and h/o non-compliance who presented to the ED on 09/11/16 after a fall at  home. 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 She likely developed neurogenic shock as she became bradycardic, hypotensive and unresponsive. Poor prognosis and if due to aneurysmal bleed, there would likely be no neurosurgical intervention possible.  PULMONARY A: Acute Respiratory Failure PNA pulm edema P:   pcxr reviewed, int prom, but had pos balance still pcxr in am  High MV, no sbt Ph noted, keep same HIGH MV On min O2 needs  CARDIOVASCULAR A:  Sinus Bradycardia: Likely due to neurogenic shock Hypotension (h/o hypertension): cardiogenic, septic P:  Dopamine for bat affect, max at 8 , no additional pressors will change outcomes Dc lasix  RENAL A:   CKD V: Matured AVF in LUE NOT AN HD CANDIDATE  P:   BMET am Even to pos balnce goals Lasix gtt - dc Strict I/Os No free water  GASTROINTESTINAL A:   NPO H/o colon cancer P:   Protonix IV If continued support then start TF  HEMATOLOGIC A:   Chronic Normocytic Anemia 2/2 CKD: Hemoglobin 7.6  P:  No anticoagulation due to [facility] SCDs  INFECTIOUS A:   PNA Strep bacteremia P:   Maintain vanc until we r/o res ceftr Maintain ceftriaxone  ENDOCRINE A:   No active issues P:   Monitor CBG Q4H  NEUROLOGIC A:   Possible Neurogenic Shock Acute Subarachnoid Hemorrhage without visualized aneurysm Trace Subdural Hematoma C6-7 disc Injury with partial ligamentum flavum tear Epidural hematoma of throughout the cervical and thoracic canal with diffuse thecal sac effacement P:   dnr remains Will discuss comfort care again with husband  FAMILY  - Updates: Talked extensively with husband, dnr, talking about comfort - Inter-disciplinary family meet or Palliative Care meeting due by: 09/16/16   Ccm time 30 min   Lavon Paganini. Titus Mould, MD, [place] Pgr: [facility] 09/25/2016 8:45 AM   Extensive discussion with family husband and  brother. We discussed the poor prognosis and likely poor quality of life. Family has decided to offer full comfort care. They are aware that the patient may be transferred to palliative care floor for continued comfort care needs. They have been fully updated on the process and expectations.

## 2016-10-06 NOTE — Progress Notes (Signed)
Ministry of presence during transition to comfort care as family & friend sat silently and/or told stories and husband held wife's hand. Chaplain available for f/u as requested.    09/22/16 1500  Clinical Encounter Type  Visited With Patient and family together  Visit Type Follow-up;Psychological support;Spiritual support;Other (Comment) (transition to comfort care)  Referral From Nurse  Spiritual Encounters  Spiritual Needs Emotional;Grief support  Stress Factors  Family Stress Factors Loss   Gerrit Heck, Chaplain

## 2016-10-06 NOTE — Progress Notes (Signed)
CRITICAL VALUE ALERT  Critical value received:  Blood Gas 7.22 CO2-19.7  Date of notification:  10-Oct-2016  Time of notification:  [id number]  Critical value read back:Yes.    Nurse who received alert:  Olen Cordial   MD notified (1st page):  Halford Chessman  Time of first page:  0600  MD notified (2nd page):  Time of second page:  Responding MD:  Halford Chessman  Time MD responded:  0600  MD to review chart.

## 2016-10-06 DEATH — deceased

## 2017-09-01 IMAGING — DX DG CHEST 1V PORT
1 series · 1 of 1 positions shown · non-contrast
Comparison: Yesterday

CLINICAL DATA: Respiratory difficulty

EXAM:
PORTABLE CHEST 1 VIEW

[chest ap]
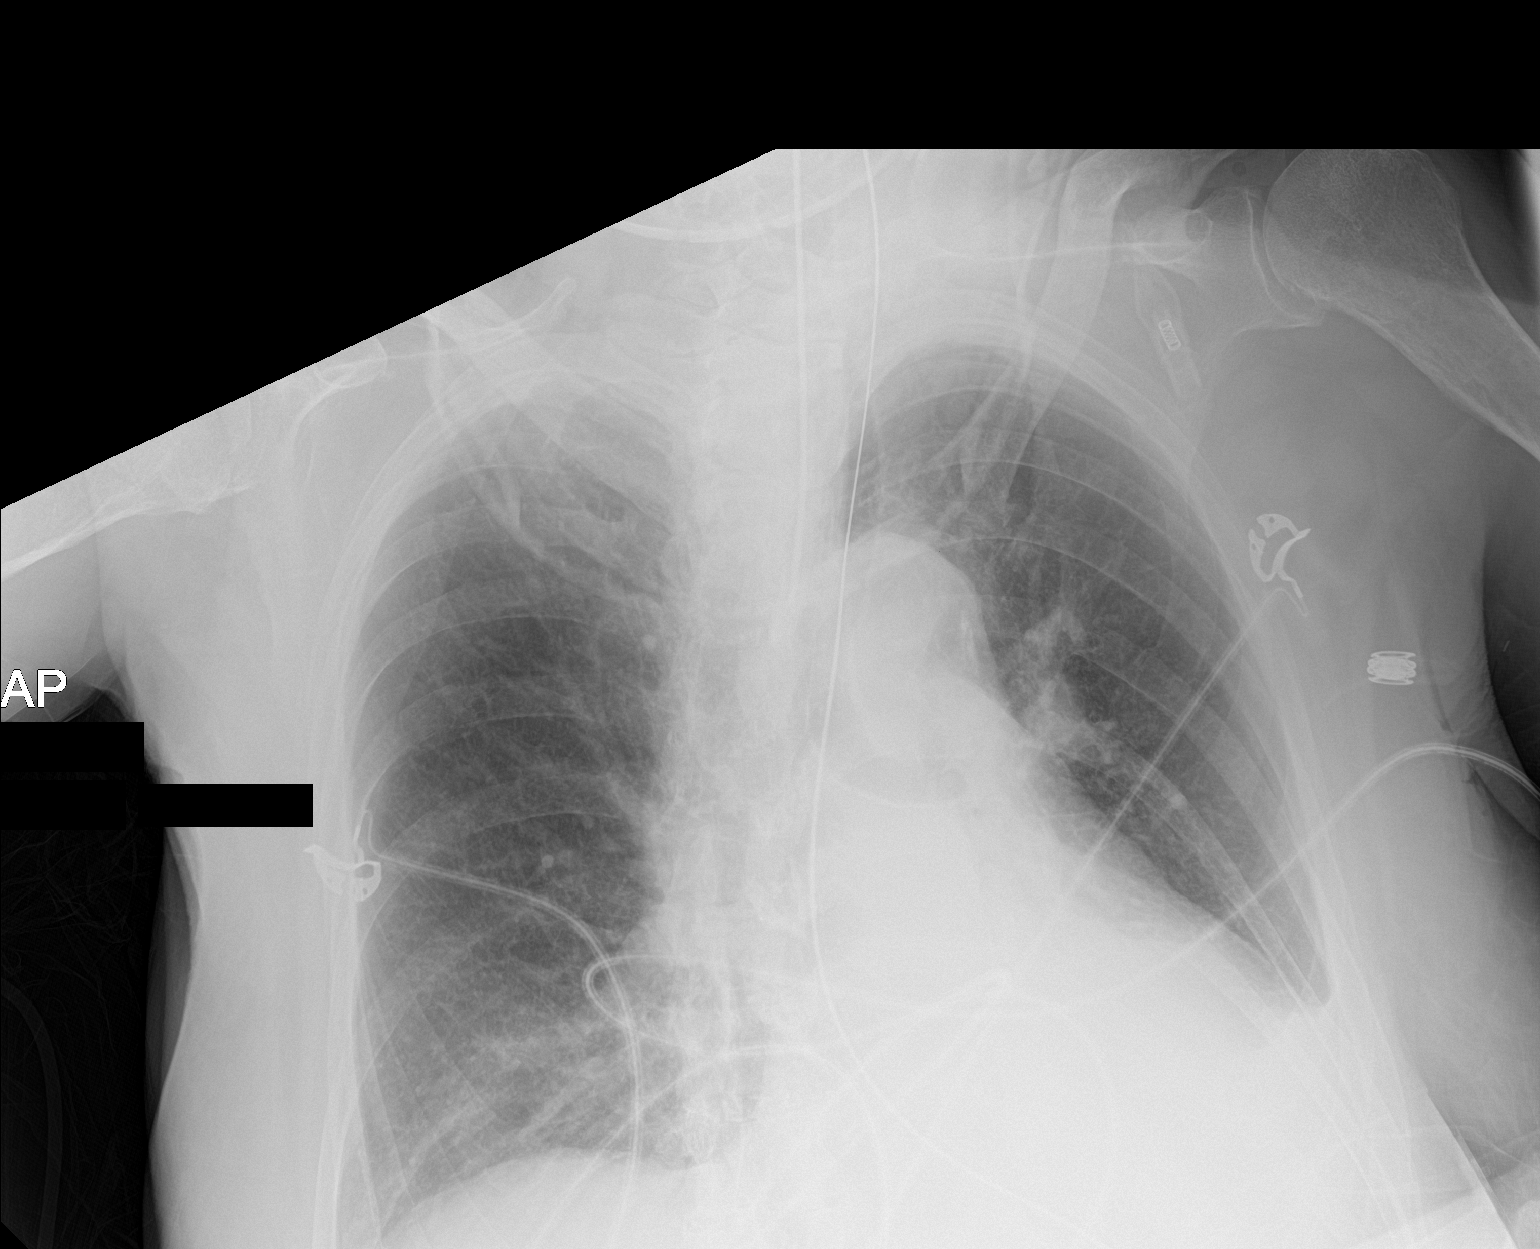

[1 of 1 positions shown; findings below may reference images not displayed]

FINDINGS: Endotracheal and NG tubes are stable. Upper normal heart size. Dense
opacity at the left base has increased. Small left pleural effusion
has increased. Hazy opacity at the right base has increased. No
pneumothorax.
IMPRESSION: Bibasilar opacity left greater than right has increased suggesting
increased consolidation.

Small left pleural effusion has increased.
